# Patient Record
Sex: Female | Born: 1995 | Race: White | Hispanic: No | Marital: Single
Health system: Midwestern US, Community
[De-identification: ages and names within clinical notes are randomized; demographics above are authoritative.]

## PROBLEM LIST (undated history)

## (undated) DIAGNOSIS — F988 Other specified behavioral and emotional disorders with onset usually occurring in childhood and adolescence: Secondary | ICD-10-CM

## (undated) DIAGNOSIS — S42309A Unspecified fracture of shaft of humerus, unspecified arm, initial encounter for closed fracture: Secondary | ICD-10-CM

## (undated) DIAGNOSIS — F909 Attention-deficit hyperactivity disorder, unspecified type: Secondary | ICD-10-CM

## (undated) HISTORY — DX: Unspecified fracture of shaft of humerus, unspecified arm, initial encounter for closed fracture: S42.309A

## (undated) HISTORY — DX: Other specified behavioral and emotional disorders with onset usually occurring in childhood and adolescence: F98.8

---
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
## 2014-01-16 ENCOUNTER — Encounter: Payer: Self-pay | Admitting: Internal Medicine

## 2014-03-10 ENCOUNTER — Telehealth: Payer: Self-pay | Admitting: Internal Medicine

## 2014-03-10 NOTE — Telephone Encounter (Signed)
Pt has misplace rx methylphenidate 20 mg. Please needs another rx

## 2014-03-10 NOTE — Telephone Encounter (Signed)
Formatting of this note might be different from the original.  Document more info .  Repeat tox screen .  And refill for one month.   Electronically signed by Madelin HeadingsPanosh, Wanda K, MD at 03/10/2014  5:30 PM EDT

## 2014-03-10 NOTE — Telephone Encounter (Signed)
Formatting of this note might be different from the original.  Pt has misplace rx methylphenidate 20 mg. Please needs another rx  Electronically signed by Leafy Roobinson, Norma J at 03/10/2014 11:08 AM EDT

## 2014-03-10 NOTE — Telephone Encounter (Signed)
Document more info . Repeat tox screen .  And refill for one month.

## 2014-03-11 NOTE — Telephone Encounter (Signed)
Formatting of this note might be different from the original.  I spoke to the patient's mom.  She wanted to know if Dr. Fabian SharpPanosh would be willing to give her prescriptions of methylphenidate while she was seeing her other physician in [place].  The reason is because Dr. Fabian SharpPanosh knows her and will give her more than one prescription at a time.  Her other physician only gives one prescription.  Mom stated that it was difficult to get her prescription to her while she is staying on campus in [place] at college.  Also difficult to do this once every month.  I spoke to Dr. Fabian SharpPanosh who advised that due to the medication being controlled, she would give the pt one prescription but we would need to talk to the new provider and advise him/her what we have done.  Also, the patient's controlled substance contract states that she will get this medication from only one provider.  I spoke to the patient's mom and informed her of all.  She stated that she has talked to the new provider and they have worked out a Higher education careers adviserplan.  Dr. Rosezella FloridaPanosh's assistance is no longer required.  Instructed mom to call back if there was anything else we could do.   Electronically signed by Nils FlackAdkins, Misty T, CMA at 03/11/2014  4:38 PM EDT

## 2014-03-11 NOTE — Telephone Encounter (Signed)
Formatting of this note might be different from the original.  Mom states pt cannot find rx, thought the pharm had kept the hard script and her refills were there at pharm. Pharm states they gave back to pt her scripts, pt states she must have threw the bag away.  Pt is off at college, parents have moved out of state, and pt will not be back to see you. Now mom tells me the rx will need to be mailed to pt. Informed Mom that RX cannot be mailed, must be pu.   FYI: Pt has seen a new md in [place], who is aware pt will need new scripts for this med, but was prepared to  fill them after hers were gone.     Electronically signed by Crist InfanteHarrald, Kathy J at 03/11/2014  8:42 AM EDT

## 2014-03-11 NOTE — Telephone Encounter (Signed)
I spoke to the patient's mom.  She wanted to know if Dr. Fabian SharpPanosh would be willing to give her prescriptions of methylphenidate while she was seeing her other physician in [place].  The reason is because Dr. Fabian SharpPanosh knows her and will give her more than one prescription at a time.  Her other physician only gives one prescription.  Mom stated that it was difficult to get her prescription to her while she is staying on campus in [place] at college.  Also difficult to do this once every month.  I spoke to Dr. Fabian SharpPanosh who advised that due to the medication being controlled, she would give the pt one prescription but we would need to talk to the new provider and advise him/her what we have done.  Also, the patient's controlled substance contract states that she will get this medication from only one provider.  I spoke to the patient's mom and informed her of all.  She stated that she has talked to the new provider and they have worked out a Higher education careers adviserplan.  Dr. Rosezella FloridaPanosh's assistance is no longer required.  Instructed mom to call back if there was anything else we could do.

## 2014-03-11 NOTE — Telephone Encounter (Signed)
Mom states pt cannot find rx, thought the pharm had kept the hard script and her refills were there at pharm. Pharm states they gave back to pt her scripts, pt states she must have threw the bag away.  Pt is off at college, parents have moved out of state, and pt will not be back to see you. Now mom tells me the rx will need to be mailed to pt. Informed Mom that RX cannot be mailed, must be pu.  FYI: Pt has seen a new md in [place], who is aware pt will need new scripts for this med, but was prepared to  fill them after hers were gone.

## 2017-08-10 ENCOUNTER — Ambulatory Visit: Admit: 2017-08-10 | Payer: PRIVATE HEALTH INSURANCE | Attending: Physician Assistant | Primary: Physician Assistant

## 2017-08-10 DIAGNOSIS — Z0000 Encounter for general adult medical examination without abnormal findings: Secondary | ICD-10-CM

## 2017-08-10 MED ORDER — ADDERALL XR 30 MG CAPSULE,EXTENDED RELEASE
30 mg | ORAL_CAPSULE | ORAL | 0 refills | Status: DC
Start: 2017-08-10 — End: 2017-09-10

## 2017-08-10 NOTE — Progress Notes (Signed)
Comprehensive Visit    Kelly Lindsey is a 21 y.o. female who presents for her comprehensive visit.  Pt is here to establish care, recently moved from [facility]. Needs refill of Adderall, empty bottle brought in from Dr. Caesar Chestnut    she does exercise 3-4x/wk. No CP, SOB or dyspnea.  Denies BRBPR. No NVDC    GYN: LMP 07/30/2017, never had pap    Past Medical History:   Diagnosis Date   ??? ADD (attention deficit disorder) 2005     Past Surgical History:   Procedure Laterality Date   ??? HX WISDOM TEETH EXTRACTION       Current Outpatient Prescriptions   Medication Sig Dispense Refill   ??? ADDERALL XR 30 mg XR capsule take 1 capsule by mouth every morning 30 Cap 0     Allergies   Allergen Reactions   ??? Sulfa (Sulfonamide Antibiotics) Rash     Social History   Substance Use Topics   ??? Smoking status: Never Smoker   ??? Smokeless tobacco: Never Used   ??? Alcohol use 3.0 - 3.6 oz/week     5 - 6 Cans of beer per week      Family History   Problem Relation Age of Onset   ??? No Known Problems Mother    ??? No Known Problems Father    ??? No Known Problems Brother    ??? Stroke Maternal Grandmother        Depression screen negative.      Routine maintenance:          Health Maintenance   Topic Date Due   ??? HPV Age 42Y-26Y (1 of 3 - Female 3 Dose Series) 09/10/2006   ??? DTaP/Tdap/Td series (1 - Tdap) 09/10/2016   ??? PAP AKA CERVICAL CYTOLOGY  09/10/2016   ??? Influenza Age 42 to Adult  07/11/2017        ROS: as per HPI otherwise NEG    Objective:  Visit Vitals   ??? BP 126/82   ??? Ht 5\' 8"  (1.727 m)   ??? Wt 150 lb (68 kg)   ??? LMP 08/03/2017  Comment: IUD   ??? BMI 22.81 kg/m2       Physical Exam  Slender caucasian, female, well groomed, in NAD  HEENT -- NC/AT, PERRL, TM intact, Throat w/o erythema, tongue midline, good dentition  Neck -- Supple, non-tender, no LAD. no thyromegaly.   Heart -- RRR. No M.  Lungs -- CTA.  Abd -- Soft. NT/ND. No masses. BS present.  Extremities -- No LE edema b/l, no cyanosis or erythema, distal pulses intact.   Breasts -- No abnormal masses or ax LAD   Skin -- no rash or lesions noted, normal turgor, no diaphresis    No results found for this or any previous visit (from the past 24 hour(s)).      Assessment/Plan:      ICD-10-CM ICD-9-CM    1. Well woman exam (no gynecological exam) Z00.00 V70.0 COLLECTION VENOUS BLOOD,VENIPUNCTURE      CBC WITH AUTOMATED DIFF      LIPID PANEL      METABOLIC PANEL, BASIC      TSH 3RD GENERATION      HIV 1/2 AG/AB, 4TH GENERATION,W RFLX CONFIRM   2. Attention deficit disorder, unspecified hyperactivity presence F98.8 314.00 ADDERALL XR 30 mg XR capsule  Contract signed; F/U monthly for refills       Routine maintenance as above.  Labs sent, will contact  with abnormals  Diet & exercise regimen rev'd  Advised reg dental & eye exam  GYN/PAP in 1 mon      Author:  Jeramyah Goodpasture A Sherrier-Edwards, PA 08/10/2017 1:59 PM

## 2017-08-11 LAB — CBC WITH AUTOMATED DIFF
ABS. BASOPHILS: 0 10*3/uL (ref 0.0–0.2)
ABS. EOSINOPHILS: 0.1 10*3/uL (ref 0.0–0.4)
ABS. IMM. GRANS.: 0 10*3/uL (ref 0.0–0.1)
ABS. MONOCYTES: 1.1 10*3/uL — ABNORMAL HIGH (ref 0.1–0.9)
ABS. NEUTROPHILS: 5.3 10*3/uL (ref 1.4–7.0)
Abs Lymphocytes: 1.3 10*3/uL (ref 0.7–3.1)
BASOPHILS: 0 %
EOSINOPHILS: 1 %
HCT: 41.1 % (ref 34.0–46.6)
HGB: 13.8 g/dL (ref 11.1–15.9)
IMMATURE GRANULOCYTES: 0 %
Lymphocytes: 17 %
MCH: 32.2 pg (ref 26.6–33.0)
MCHC: 33.6 g/dL (ref 31.5–35.7)
MCV: 96 fL (ref 79–97)
MONOCYTES: 14 %
NEUTROPHILS: 68 %
PLATELET: 276 10*3/uL (ref 150–379)
RBC: 4.28 x10E6/uL (ref 3.77–5.28)
RDW: 12.2 % — ABNORMAL LOW (ref 12.3–15.4)
WBC: 7.8 10*3/uL (ref 3.4–10.8)

## 2017-08-11 LAB — LIPID PANEL
Cholesterol, total: 178 mg/dL (ref 100–199)
HDL Cholesterol: 68 mg/dL (ref >39)
LDL, calculated: 96 mg/dL (ref 0–99)
Triglyceride: 69 mg/dL (ref 0–149)
VLDL, calculated: 14 mg/dL (ref 5–40)

## 2017-08-11 LAB — METABOLIC PANEL, BASIC
BUN/Creatinine ratio: 15 (ref 9–23)
BUN: 10 mg/dL (ref 6–20)
CO2: 24 mmol/L (ref 20–29)
Calcium: 9.6 mg/dL (ref 8.7–10.2)
Chloride: 100 mmol/L (ref 96–106)
Creatinine: 0.66 mg/dL (ref 0.57–1.00)
GFR est AA: 146 mL/min/{1.73_m2} (ref >59)
GFR est non-AA: 127 mL/min/{1.73_m2} (ref >59)
Glucose: 98 mg/dL (ref 65–99)
Potassium: 4.2 mmol/L (ref 3.5–5.2)
Sodium: 140 mmol/L (ref 134–144)

## 2017-08-11 LAB — HIV 1/2 ANTIGEN/ANTIBODY, FOURTH GENERATION W/RFL: HIV Screen 4th Generation wRfx: NONREACTIVE

## 2017-08-11 LAB — TSH 3RD GENERATION: TSH: 1.1 u[IU]/mL (ref 0.450–4.500)

## 2017-08-11 LAB — HIV 1/2 AG/AB, 4TH GENERATION,W RFLX CONFIRM: HIV SCREEN 4TH GENERATION WRFX: NONREACTIVE

## 2017-08-15 NOTE — Progress Notes (Signed)
Pt made aware of normal labs

## 2017-09-07 ENCOUNTER — Encounter: Attending: Physician Assistant | Primary: Physician Assistant

## 2017-09-10 ENCOUNTER — Ambulatory Visit: Admit: 2017-09-10 | Payer: PRIVATE HEALTH INSURANCE | Attending: Physician Assistant | Primary: Physician Assistant

## 2017-09-10 DIAGNOSIS — F988 Other specified behavioral and emotional disorders with onset usually occurring in childhood and adolescence: Secondary | ICD-10-CM

## 2017-09-10 MED ORDER — ADDERALL XR 30 MG CAPSULE,EXTENDED RELEASE
30 mg | ORAL_CAPSULE | ORAL | 0 refills | Status: DC
Start: 2017-09-10 — End: 2017-10-09

## 2017-09-10 NOTE — Progress Notes (Signed)
Kelly Lindsey is a 21 y.o. female and presents for   Chief Complaint   Patient presents with   ??? Follow-up     1 month and pap     No complaints   Denies cp, sob or dyspnea. No NVDC or dysuria    Current Outpatient Prescriptions   Medication Sig Dispense Refill   ??? ADDERALL XR 30 mg XR capsule take 1 capsule by mouth every morning 30 Cap 0     Allergies   Allergen Reactions   ??? Sulfa (Sulfonamide Antibiotics) Rash     Past Medical History:   Diagnosis Date   ??? ADD (attention deficit disorder) 2005     Past Surgical History:   Procedure Laterality Date   ??? HX WISDOM TEETH EXTRACTION       Family History   Problem Relation Age of Onset   ??? No Known Problems Mother    ??? No Known Problems Father    ??? No Known Problems Brother    ??? Stroke Maternal Grandmother      Social History   Substance Use Topics   ??? Smoking status: Never Smoker   ??? Smokeless tobacco: Never Used   ??? Alcohol use 3.0 - 3.6 oz/week     5 - 6 Cans of beer per week        ROS: as per HPI otherwise NEG    Objective:  Visit Vitals   ??? BP 110/74   ??? Ht 5\' 8"  (1.727 m)   ??? Wt 150 lb (68 kg)   ??? BMI 22.81 kg/m2       In NAD. Alert.         No results found for this or any previous visit (from the past 24 hour(s)).    Assessment/Plan:    ICD-10-CM ICD-9-CM    1. Attention deficit disorder, unspecified hyperactivity presence F98.8 314.00 ADDERALL XR 30 mg XR capsule   2. Medication refill Z76.0 V68.1        RTO 1 mon GYN/PAP      Author:  Adair Lauderback A Sherrier-Edwards, PA 09/10/2017 11:41 AM

## 2017-10-09 ENCOUNTER — Ambulatory Visit: Admit: 2017-10-09 | Payer: PRIVATE HEALTH INSURANCE | Attending: Physician Assistant | Primary: Physician Assistant

## 2017-10-09 DIAGNOSIS — Z01419 Encounter for gynecological examination (general) (routine) without abnormal findings: Secondary | ICD-10-CM

## 2017-10-09 MED ORDER — ADDERALL XR 30 MG CAPSULE,EXTENDED RELEASE
30 mg | ORAL_CAPSULE | ORAL | 0 refills | Status: DC
Start: 2017-10-09 — End: 2017-11-07

## 2017-10-09 NOTE — Progress Notes (Signed)
Kelly Lindsey is a 21 y.o. female and presents with   Chief Complaint   Patient presents with   ??? Gyn Exam   ??? 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 No NVDC or urinary sx    Current Outpatient Medications   Medication Sig Dispense Refill   ??? ADDERALL XR 30 mg XR capsule take 1 capsule by mouth every morning 30 Cap 0     Allergies   Allergen Reactions   ??? Sulfa (Sulfonamide Antibiotics) Rash     Past Medical History:   Diagnosis Date   ??? ADD (attention deficit disorder) 2005     Past Surgical History:   Procedure Laterality Date   ??? HX WISDOM TEETH EXTRACTION       Family History   Problem Relation Age of Onset   ??? No Known Problems Mother    ??? No Known Problems Father    ??? No Known Problems Brother    ??? Stroke Maternal Grandmother      Social History     Tobacco Use   ??? Smoking status: Never Smoker   ??? Smokeless tobacco: Never Used   Substance Use Topics   ??? Alcohol use: Yes     Alcohol/week: 3.0 - 3.6 oz     Types: 5 - 6 Cans of beer per week        ROS: as per HPI otherwise NEG    Objective:  Visit Vitals  BP 120/70   Ht 5\' 8"  (1.727 m)   Wt 148 lb (67.1 kg)   BMI 22.50 kg/m??       In NAD. Alert.  Breast: done at last visit  Pelvic: Ext genitalia without lesion  Cx: closed, smooth, no contact bleeding, normal mucoid d/c, no CMT  Biman: uterus and ovaries non-tender, not enlarged  Rectal: without lesions         No results found for this or any previous visit (from the past 24 hour(s)).    Assessment/Plan:    ICD-10-CM ICD-9-CM    1. Well female exam with routine gynecological exam Z01.419 V72.31 PAP IG, CT-NG, RFX HPV [id number], [id number])      CANCELED: PAP IG, CT-NG, AOZ-[id number], [id number])   2. Attention deficit disorder, unspecified hyperactivity presence F98.8 314.00 ADDERALL XR 30 mg XR capsule    3. Medication refill Z76.0 V68.1        Pt counseled extensively about HSV  PAP, sent with gc/ct  Adderall refilled  F/U 1 mon         Author:  Casady Voshell A Sherrier-Edwards, PA 10/10/2017 2:53 PM

## 2017-10-09 NOTE — Progress Notes (Signed)
LM with normal results

## 2017-10-11 LAB — PAP IG, CT-NG, RFX HPV ASCU(183194, 507301)
.: 0
CHLAMYDIA, NUC. ACID AMP, 186114: NEGATIVE
Chlamydia, Nuc. Acid Amp.: NEGATIVE
GONOCOCCUS, NUC. ACID AMP, 186122: NEGATIVE
Gonococcus, Nuc. Acid Amp.: NEGATIVE
LABCORP 019018: 0

## 2017-11-07 ENCOUNTER — Ambulatory Visit: Admit: 2017-11-07 | Payer: PRIVATE HEALTH INSURANCE | Attending: Physician Assistant | Primary: Physician Assistant

## 2017-11-07 DIAGNOSIS — F988 Other specified behavioral and emotional disorders with onset usually occurring in childhood and adolescence: Secondary | ICD-10-CM

## 2017-11-07 MED ORDER — ADDERALL XR 30 MG CAPSULE,EXTENDED RELEASE
30 mg | ORAL_CAPSULE | ORAL | 0 refills | Status: DC
Start: 2017-11-07 — End: 2017-11-07

## 2017-11-07 MED ORDER — ADDERALL XR 30 MG CAPSULE,EXTENDED RELEASE
30 mg | ORAL_CAPSULE | ORAL | 0 refills | Status: DC
Start: 2017-11-07 — End: 2018-02-04

## 2017-11-07 NOTE — Progress Notes (Signed)
Kelly Lindsey is a 21 y.o. female and presents with   Chief Complaint   Patient presents with   ??? Follow-up     1 month (meds)     No complaints, will be traveling for X-mas    Denies cp, sob or dyspnea. No NVDC or Urinary sx    Current Outpatient Medications   Medication Sig Dispense Refill   ??? [START ON 01/07/2018] ADDERALL XR 30 mg XR capsule Earliest Fill Date: 01/07/18.  take 1 capsule by mouth every morning 30 Cap 0     Allergies   Allergen Reactions   ??? Sulfa (Sulfonamide Antibiotics) Rash     Past Medical History:   Diagnosis Date   ??? ADD (attention deficit disorder) 2005     Past Surgical History:   Procedure Laterality Date   ??? HX WISDOM TEETH EXTRACTION       Family History   Problem Relation Age of Onset   ??? No Known Problems Mother    ??? No Known Problems Father    ??? No Known Problems Brother    ??? Stroke Maternal Grandmother      Social History     Tobacco Use   ??? Smoking status: Never Smoker   ??? Smokeless tobacco: Never Used   Substance Use Topics   ??? Alcohol use: Yes     Alcohol/week: 3.0 - 3.6 oz     Types: 5 - 6 Cans of beer per week        ROS: as per HPI otherwise NEG    Objective:  Visit Vitals  BP 108/70   Wt 148 lb (67.1 kg)   BMI 22.50 kg/m??       In NAD. Alert.       No results found for this or any previous visit (from the past 24 hour(s)).    Assessment/Plan:    ICD-10-CM ICD-9-CM    1. Attention deficit disorder, unspecified hyperactivity presence F98.8 314.00 ADDERALL XR 30 mg XR capsule      DISCONTINUED: ADDERALL XR 30 mg XR capsule      DISCONTINUED: ADDERALL XR 30 mg XR capsule       Meds refilled   RTO 3 mon      Author:  Anela Bensman A Sherrier-Edwards, PA 11/07/2017 11:46 AM

## 2018-02-04 ENCOUNTER — Ambulatory Visit: Admit: 2018-02-04 | Attending: Physician Assistant | Primary: Physician Assistant

## 2018-02-04 DIAGNOSIS — F988 Other specified behavioral and emotional disorders with onset usually occurring in childhood and adolescence: Secondary | ICD-10-CM

## 2018-02-04 MED ORDER — ADDERALL XR 30 MG CAPSULE,EXTENDED RELEASE
30 mg | ORAL_CAPSULE | ORAL | 0 refills | Status: DC
Start: 2018-02-04 — End: 2018-02-04

## 2018-02-04 MED ORDER — ADDERALL XR 30 MG CAPSULE,EXTENDED RELEASE
30 mg | ORAL_CAPSULE | ORAL | 0 refills | Status: DC
Start: 2018-02-04 — End: 2018-05-02

## 2018-02-04 NOTE — Progress Notes (Signed)
Kelly Lindsey is a 22 y.o. female and presents with   Chief Complaint   Patient presents with   ??? Follow-up     Here for refills, no complaints    Denies cp, sob or dyspnea. No NVDC or Urinary sx    Current Outpatient Medications   Medication Sig Dispense Refill   ??? [START ON 04/04/2018] ADDERALL XR 30 mg XR capsule Earliest Fill Date: 04/04/18.  take 1 capsule by mouth every morning 30 Cap 0     Allergies   Allergen Reactions   ??? Sulfa (Sulfonamide Antibiotics) Rash     Past Medical History:   Diagnosis Date   ??? ADD (attention deficit disorder) 2005     Past Surgical History:   Procedure Laterality Date   ??? HX WISDOM TEETH EXTRACTION       Family History   Problem Relation Age of Onset   ??? No Known Problems Mother    ??? No Known Problems Father    ??? No Known Problems Brother    ??? Stroke Maternal Grandmother      Social History     Tobacco Use   ??? Smoking status: Never Smoker   ??? Smokeless tobacco: Never Used   Substance Use Topics   ??? Alcohol use: Yes     Alcohol/week: 3.0 - 3.6 oz     Types: 5 - 6 Cans of beer per week        ROS: as per HPI otherwise NEG    Objective:  Visit Vitals  BP 120/70   Ht 5\' 8"  (1.727 m)   BMI 22.50 kg/m??       In NAD. Alert.  Heart -- RRR.  Lungs -- CTA.  Abdomen -- Benign.       No results found for this or any previous visit (from the past 24 hour(s)).    Assessment/Plan:    ICD-10-CM ICD-9-CM    1. Attention deficit disorder, unspecified hyperactivity presence F98.8 314.00 ADDERALL XR 30 mg XR capsule      DISCONTINUED: ADDERALL XR 30 mg XR capsule      DISCONTINUED: ADDERALL XR 30 mg XR capsule       Refills given   F/U 3 mon/PRN       Author:  Hoby Kawai A Sherrier-Edwards, PA 02/04/2018 11:04 AM

## 2018-05-02 ENCOUNTER — Encounter

## 2018-05-02 ENCOUNTER — Telehealth

## 2018-05-02 MED ORDER — ADDERALL XR 30 MG CAPSULE,EXTENDED RELEASE
30 mg | ORAL_CAPSULE | ORAL | 0 refills | Status: DC
Start: 2018-05-02 — End: 2018-07-30

## 2018-05-02 MED ORDER — ADDERALL XR 30 MG CAPSULE,EXTENDED RELEASE
30 mg | ORAL_CAPSULE | ORAL | 0 refills | Status: DC
Start: 2018-05-02 — End: 2018-05-02

## 2018-05-02 NOTE — Telephone Encounter (Signed)
Adderall Xr 30 mg

## 2018-07-29 ENCOUNTER — Telehealth

## 2018-07-29 NOTE — Telephone Encounter (Signed)
Adderall XR 30 mg

## 2018-07-30 MED ORDER — ADDERALL XR 30 MG CAPSULE,EXTENDED RELEASE
30 mg | ORAL_CAPSULE | ORAL | 0 refills | Status: DC
Start: 2018-07-30 — End: 2018-08-28

## 2018-07-30 NOTE — Telephone Encounter (Signed)
rx printed

## 2018-08-28 ENCOUNTER — Telehealth

## 2018-08-28 MED ORDER — ADDERALL XR 30 MG CAPSULE,EXTENDED RELEASE
30 mg | ORAL_CAPSULE | ORAL | 0 refills | Status: DC
Start: 2018-08-28 — End: 2018-09-27

## 2018-08-28 NOTE — Telephone Encounter (Signed)
Pt would like a refill on her    ADDERALL XR 30 mg       She would also like to be referred to a Gynecologist please    Ph# [phone]

## 2018-09-26 ENCOUNTER — Telehealth

## 2018-09-26 NOTE — Telephone Encounter (Signed)
Pt would like a refill on her      ADDERALL XR 30 mg ( she would like a 3 month supply again please)      Ph# [phone]

## 2018-09-27 ENCOUNTER — Encounter

## 2018-09-27 MED ORDER — ADDERALL XR 30 MG CAPSULE,EXTENDED RELEASE
30 mg | ORAL_CAPSULE | ORAL | 0 refills | Status: DC
Start: 2018-09-27 — End: 2018-12-25

## 2018-09-27 MED ORDER — ADDERALL XR 30 MG CAPSULE,EXTENDED RELEASE
30 mg | ORAL_CAPSULE | ORAL | 0 refills | Status: DC
Start: 2018-09-27 — End: 2018-09-27

## 2018-12-25 ENCOUNTER — Telehealth

## 2018-12-25 MED ORDER — ADDERALL XR 30 MG CAPSULE,EXTENDED RELEASE
30 mg | ORAL_CAPSULE | ORAL | 0 refills | Status: DC
Start: 2018-12-25 — End: 2019-01-22

## 2018-12-25 NOTE — Telephone Encounter (Signed)
Pt would like a refill on her    ADDERALL XR 30 mg    Ph# [phone] Pt would like a call when it is ready to pick up

## 2019-01-22 ENCOUNTER — Telehealth

## 2019-01-22 MED ORDER — ADDERALL XR 30 MG CAPSULE,EXTENDED RELEASE
30 mg | ORAL_CAPSULE | ORAL | 0 refills | Status: DC
Start: 2019-01-22 — End: 2019-02-21

## 2019-01-22 NOTE — Telephone Encounter (Signed)
Pt would like a refill on her       ADDERALL XR 30 mg      Ph# [phone]

## 2019-02-21 ENCOUNTER — Ambulatory Visit: Admit: 2019-02-21 | Attending: Physician Assistant | Primary: Physician Assistant

## 2019-02-21 ENCOUNTER — Ambulatory Visit: Attending: Physician Assistant | Primary: Physician Assistant

## 2019-02-21 DIAGNOSIS — F988 Other specified behavioral and emotional disorders with onset usually occurring in childhood and adolescence: Secondary | ICD-10-CM

## 2019-02-21 MED ORDER — ADDERALL XR 30 MG CAPSULE,EXTENDED RELEASE
30 mg | ORAL_CAPSULE | ORAL | 0 refills | Status: DC
Start: 2019-02-21 — End: 2019-03-25

## 2019-02-21 NOTE — Progress Notes (Signed)
Kelly Lindsey is a 23 y.o. female and presents with   Chief Complaint   Patient presents with   ??? Medication Refill     Here for refills, no complaints    Denies cp, sob or dyspnea. No NVDC or Urinary sx    Current Outpatient Medications   Medication Sig Dispense Refill   ??? levonorgestreL (Skyla) 14 mcg/24 hrs (3 yrs) 13.5 mg IUD IUD Skyla 14 mcg/24 hrs (3 yrs) 13.5 mg intrauterine device   Take by intrauterine route.     ??? Adderall XR 30 mg XR capsule take 1 capsule by mouth every morning 30 Cap 0     Allergies   Allergen Reactions   ??? Sulfa (Sulfonamide Antibiotics) Rash     Past Medical History:   Diagnosis Date   ??? ADD (attention deficit disorder) 2005     Past Surgical History:   Procedure Laterality Date   ??? HX WISDOM TEETH EXTRACTION       Family History   Problem Relation Age of Onset   ??? No Known Problems Mother    ??? No Known Problems Father    ??? No Known Problems Brother    ??? Stroke Maternal Grandmother      Social History     Tobacco Use   ??? Smoking status: Never Smoker   ??? Smokeless tobacco: Never Used   Substance Use Topics   ??? Alcohol use: Yes     Alcohol/week: 5.0 - 6.0 standard drinks     Types: 5 - 6 Cans of beer per week        ROS: as per HPI otherwise NEG    Objective:  Visit Vitals  BP 115/60   Ht 5\' 8"  (1.727 m)   Wt 161 lb (73 kg)   BMI 24.48 kg/m??       In NAD. Alert.  Heart -- RRR.  Lungs -- CTA.    Extremities -- No edema       No results found for this or any previous visit (from the past 24 hour(s)).    Assessment/Plan:    ICD-10-CM ICD-9-CM    1. Attention deficit disorder, unspecified hyperactivity presence F98.8 314.00 Adderall XR 30 mg XR capsule       PMP checked  Adderall refilled  Advised AWV and labs in 1 mon        Author:  Angas Isabell A Charnel Giles-Edwards, PA 02/21/2019 3:22 PM

## 2019-02-21 NOTE — Telephone Encounter (Signed)
Pt needs appt before refill.  Spoke with patient set appt .

## 2019-02-21 NOTE — Telephone Encounter (Signed)
Pt would like a refill on her        ADDERALL XR 30 mg      Ph# [phone]

## 2019-02-21 NOTE — Progress Notes (Signed)
Kelly Lindsey is a 23 y.o. female and presents with   Chief Complaint   Patient presents with   ??? Medication Refill     Here for refills, no complaints    Denies cp, sob or dyspnea. No NVDC or Urinary sx    Current Outpatient Medications   Medication Sig Dispense Refill   ??? levonorgestreL (Skyla) 14 mcg/24 hrs (3 yrs) 13.5 mg IUD IUD Skyla 14 mcg/24 hrs (3 yrs) 13.5 mg intrauterine device   Take by intrauterine route.     ??? Adderall XR 30 mg XR capsule take 1 capsule by mouth every morning 30 Cap 0     Allergies   Allergen Reactions   ??? Sulfa (Sulfonamide Antibiotics) Rash     Past Medical History:   Diagnosis Date   ??? ADD (attention deficit disorder) 2005     Past Surgical History:   Procedure Laterality Date   ??? HX WISDOM TEETH EXTRACTION       Family History   Problem Relation Age of Onset   ??? No Known Problems Mother    ??? No Known Problems Father    ??? No Known Problems Brother    ??? Stroke Maternal Grandmother      Social History     Tobacco Use   ??? Smoking status: Never Smoker   ??? Smokeless tobacco: Never Used   Substance Use Topics   ??? Alcohol use: Yes     Alcohol/week: 5.0 - 6.0 standard drinks     Types: 5 - 6 Cans of beer per week        ROS: as per HPI otherwise NEG    Objective:  Visit Vitals  BP 115/60   Ht 5' 8" (1.727 m)   Wt 161 lb (73 kg)   BMI 24.48 kg/m??       In NAD. Alert.  Heart -- RRR.  Lungs -- CTA.    Extremities -- No edema       No results found for this or any previous visit (from the past 24 hour(s)).    Assessment/Plan:    ICD-10-CM ICD-9-CM    1. Attention deficit disorder, unspecified hyperactivity presence F98.8 314.00 Adderall XR 30 mg XR capsule       PMP checked  Adderall refilled  Advised AWV and labs in 1 mon        Author:  Connelly [place] A Sherrier-Edwards, PA 02/21/2019 3:22 PM

## 2019-03-25 ENCOUNTER — Encounter: Attending: Physician Assistant | Primary: Physician Assistant

## 2019-03-25 ENCOUNTER — Ambulatory Visit: Admit: 2019-03-25 | Attending: Physician Assistant | Primary: Physician Assistant

## 2019-03-25 ENCOUNTER — Ambulatory Visit: Attending: Physician Assistant | Primary: Physician Assistant

## 2019-03-25 DIAGNOSIS — Z0000 Encounter for general adult medical examination without abnormal findings: Secondary | ICD-10-CM

## 2019-03-25 MED ORDER — AMPHETAMINE-DEXTROAMPHETAMINE SR 30 MG 24 HR CAP
30 mg | ORAL_CAPSULE | ORAL | 0 refills | Status: DC
Start: 2019-03-25 — End: 2019-04-22

## 2019-03-25 NOTE — Progress Notes (Signed)
LM on VM, results normal

## 2019-03-25 NOTE — Progress Notes (Signed)
Comprehensive Visit    Kelly Lindsey is a 23 y.o. female who presents for her comprehensive visit.    Pt is doing well overall.   Is wearing a mask and practicing social distancing when outside, also washing hands regularly.    she does exercise 3-4x/wk: bike, yoga. No CP, SOB or dyspnea.  Denies BRBPR. No NVDC    GYN: LMP 03/21/2019; LPS 07/2017: Normal.    Eye exam: > 60yrs  Dental cleaning: >74yrs  Derm: no visit, wears sunblock    Past Medical History:   Diagnosis Date   ??? ADD (attention deficit disorder) 2005     Past Surgical History:   Procedure Laterality Date   ??? HX WISDOM TEETH EXTRACTION       Current Outpatient Medications   Medication Sig Dispense Refill   ??? amphetamine-dextroamphetamine XR (Adderall XR) 30 mg XR capsule take 1 capsule by mouth every morning 30 Cap 0   ??? levonorgestreL (Skyla) 14 mcg/24 hrs (3 yrs) 13.5 mg IUD IUD Skyla 14 mcg/24 hrs (3 yrs) 13.5 mg intrauterine device   Take by intrauterine route.       Allergies   Allergen Reactions   ??? Sulfa (Sulfonamide Antibiotics) Rash     Social History     Tobacco Use   ??? Smoking status: Never Smoker   ??? Smokeless tobacco: Never Used   Substance Use Topics   ??? Alcohol use: Yes     Alcohol/week: 5.0 - 6.0 standard drinks     Types: 5 - 6 Cans of beer per week      Family History   Problem Relation Age of Onset   ??? No Known Problems Mother    ??? No Known Problems Father    ??? No Known Problems Brother    ??? Stroke Maternal Grandmother        Depression screen negative.      Routine maintenance:          Health Maintenance   Topic Date Due   ??? HPV Age 9Y-26Y (1 - 2-dose series) 09/10/2006   ??? DTaP/Tdap/Td series (1 - Tdap) 09/10/2016   ??? Influenza Age 18 to Adult  07/12/2019   ??? PAP AKA CERVICAL CYTOLOGY  10/09/2020   ??? Pneumococcal 0-64 years  Aged Out        ROS: as per HPI otherwise NEG    Objective:  Visit Vitals  BP 110/80   Ht 5\' 8"  (1.727 m)   Wt 158 lb (71.7 kg)   BMI 24.02 kg/m??       Physical Exam  Slender, caucasian female, well groomed, in  NAD  HEENT -- NC/AT, TM intact, Throat w/o erythema, tongue midline  Neck -- Supple, non-tender, no LAD. no thyromegaly.   Heart -- RRR. No M.  Lungs -- CTA.  Abd -- Soft. NT/ND. No masses. BS present.  Extremities -- No LE edema b/l, no cyanosis or erythema, distal pulses intact.  Breasts -- No abnormal masses or ax LAD   Skin -- dry, warm, no rash or lesions noted.    No results found for this or any previous visit (from the past 24 hour(s)).      Assessment/Plan:      ICD-10-CM ICD-9-CM    1. General medical exam Z00.00 V70.9 COLLECTION VENOUS BLOOD,VENIPUNCTURE      CBC WITH AUTOMATED DIFF      METABOLIC PANEL, COMPREHENSIVE      LIPID PANEL      TETANUS, DIPHTHERIA  TOXOIDS AND ACELLULAR PERTUSSIS VACCINE (TDAP), IN INDIVIDS. >=7, IM      UA/M W/RFLX CULTURE, COMP      CHLAMYDIA/GC PCR   2. Attention deficit disorder, unspecified hyperactivity presence F98.8 314.00 amphetamine-dextroamphetamine XR (Adderall XR) 30 mg XR capsule   3. Medication refill Z76.0 V68.1    4. Need for Tdap vaccination Z23 V06.1 TETANUS, DIPHTHERIA TOXOIDS AND ACELLULAR PERTUSSIS VACCINE (TDAP), IN INDIVIDS. >=7, IM       Routine maintenance as above.  Labs sent, will contact if abnormal  Diet & exercise regimen rev'd  Advised reg dental & eye exam  Schedule of future lab studies rev'd with pt    Tdap today  Further mang't pending lab results  RTO 59yr/PRN    Adderall refilled  Can call for refill in 1 mon         Author:  Caylie Sandquist A Amarylis Rovito-Edwards, PA 03/25/2019 9:36 AM

## 2019-03-25 NOTE — Progress Notes (Signed)
Comprehensive Visit    Kelly Lindsey is a 23 y.o. female who presents for her comprehensive visit.    Pt is doing well overall.   Is wearing a mask and practicing social distancing when outside, also washing hands regularly.    she does exercise 3-4x/wk: bike, yoga. No CP, SOB or dyspnea.  Denies BRBPR. No NVDC    GYN: LMP 03/21/2019; LPS 07/2017: Normal.    Eye exam: > 4644yrs  Dental cleaning: >8944yrs  Derm: no visit, wears sunblock    Past Medical History:   Diagnosis Date   ??? ADD (attention deficit disorder) 2005     Past Surgical History:   Procedure Laterality Date   ??? HX WISDOM TEETH EXTRACTION       Current Outpatient Medications   Medication Sig Dispense Refill   ??? amphetamine-dextroamphetamine XR (Adderall XR) 30 mg XR capsule take 1 capsule by mouth every morning 30 Cap 0   ??? levonorgestreL (Skyla) 14 mcg/24 hrs (3 yrs) 13.5 mg IUD IUD Skyla 14 mcg/24 hrs (3 yrs) 13.5 mg intrauterine device   Take by intrauterine route.       Allergies   Allergen Reactions   ??? Sulfa (Sulfonamide Antibiotics) Rash     Social History     Tobacco Use   ??? Smoking status: Never Smoker   ??? Smokeless tobacco: Never Used   Substance Use Topics   ??? Alcohol use: Yes     Alcohol/week: 5.0 - 6.0 standard drinks     Types: 5 - 6 Cans of beer per week      Family History   Problem Relation Age of Onset   ??? No Known Problems Mother    ??? No Known Problems Father    ??? No Known Problems Brother    ??? Stroke Maternal Grandmother        Depression screen negative.      Routine maintenance:          Health Maintenance   Topic Date Due   ??? HPV Age 9Y-26Y (1 - 2-dose series) 09/10/2006   ??? DTaP/Tdap/Td series (1 - Tdap) 09/10/2016   ??? Influenza Age 349 to Adult  07/12/2019   ??? PAP AKA CERVICAL CYTOLOGY  10/09/2020   ??? Pneumococcal 0-64 years  Aged Out        ROS: as per HPI otherwise NEG    Objective:  Visit Vitals  BP 110/80   Ht 5\' 8"  (1.727 m)   Wt 158 lb (71.7 kg)   BMI 24.02 kg/m??       Physical Exam   Slender, caucasian female, well groomed, in NAD  HEENT -- NC/AT, TM intact, Throat w/o erythema, tongue midline  Neck -- Supple, non-tender, no LAD. no thyromegaly.   Heart -- RRR. No M.  Lungs -- CTA.  Abd -- Soft. NT/ND. No masses. BS present.  Extremities -- No LE edema b/l, no cyanosis or erythema, distal pulses intact.  Breasts -- No abnormal masses or ax LAD   Skin -- dry, warm, no rash or lesions noted.    No results found for this or any previous visit (from the past 24 hour(s)).      Assessment/Plan:      ICD-10-CM ICD-9-CM    1. General medical exam Z00.00 V70.9 COLLECTION VENOUS BLOOD,VENIPUNCTURE      CBC WITH AUTOMATED DIFF      METABOLIC PANEL, COMPREHENSIVE      LIPID PANEL      TETANUS, DIPHTHERIA  TOXOIDS AND ACELLULAR PERTUSSIS VACCINE (TDAP), IN INDIVIDS. >=7, IM      UA/M W/RFLX CULTURE, COMP      CHLAMYDIA/GC PCR   2. Attention deficit disorder, unspecified hyperactivity presence F98.8 314.00 amphetamine-dextroamphetamine XR (Adderall XR) 30 mg XR capsule   3. Medication refill Z76.0 V68.1    4. Need for Tdap vaccination Z23 V06.1 TETANUS, DIPHTHERIA TOXOIDS AND ACELLULAR PERTUSSIS VACCINE (TDAP), IN INDIVIDS. >=7, IM       Routine maintenance as above.  Labs sent, will contact if abnormal  Diet & exercise regimen rev'd  Advised reg dental & eye exam  Schedule of future lab studies rev'd with pt    Tdap today  Further mang't pending lab results  RTO 59yr/PRN    Adderall refilled  Can call for refill in 1 mon         Author:  Caylie Sandquist A Sherrier-Edwards, PA 03/25/2019 9:36 AM

## 2019-03-26 LAB — UA/M W/RFLX CULTURE, COMP
Bilirubin, Urine: NEGATIVE
Bilirubin: NEGATIVE
Blood, Urine: NEGATIVE
Blood: NEGATIVE
Glucose, UA: NEGATIVE
Glucose: NEGATIVE
Ketone: NEGATIVE
Ketones, Urine: NEGATIVE
Leukocyte Esterase, Urine: NEGATIVE
Leukocyte Esterase: NEGATIVE
Nitrite, Urine: NEGATIVE
Nitrites: NEGATIVE
Protein, UA: NEGATIVE
Protein: NEGATIVE
Specific Gravity, UA: 1.028 NA (ref 1.005–1.030)
Specific Gravity: 1.028 (ref 1.005–1.030)
Urobilinogen, Urine: 0.2 mg/dL (ref 0.2–1.0)
Urobilinogen: 0.2 mg/dL (ref 0.2–1.0)
pH (UA): 6 (ref 5.0–7.5)
pH, UA: 6 NA (ref 5.0–7.5)

## 2019-03-26 LAB — CBC WITH AUTOMATED DIFF
ABS. BASOPHILS: 0 10*3/uL (ref 0.0–0.2)
ABS. EOSINOPHILS: 0.1 10*3/uL (ref 0.0–0.4)
ABS. IMM. GRANS.: 0 10*3/uL (ref 0.0–0.1)
ABS. MONOCYTES: 0.5 10*3/uL (ref 0.1–0.9)
ABS. NEUTROPHILS: 3 10*3/uL (ref 1.4–7.0)
Abs Lymphocytes: 1.5 10*3/uL (ref 0.7–3.1)
BASOPHILS: 1 %
EOSINOPHILS: 2 %
HCT: 38 % (ref 34.0–46.6)
HGB: 13.4 g/dL (ref 11.1–15.9)
IMMATURE GRANULOCYTES: 0 %
Lymphocytes: 29 %
MCH: 32.8 pg (ref 26.6–33.0)
MCHC: 35.3 g/dL (ref 31.5–35.7)
MCV: 93 fL (ref 79–97)
MONOCYTES: 10 %
NEUTROPHILS: 58 %
PLATELET: 266 10*3/uL (ref 150–450)
RBC: 4.08 x10E6/uL (ref 3.77–5.28)
RDW: 11.3 % — ABNORMAL LOW (ref 11.7–15.4)
WBC: 5.2 10*3/uL (ref 3.4–10.8)

## 2019-03-26 LAB — SPECIMEN STATUS REPORT

## 2019-03-26 LAB — LIPID PANEL
Cholesterol, Total: 187 mg/dL (ref 100–199)
Cholesterol, total: 187 mg/dL (ref 100–199)
HDL Cholesterol: 72 mg/dL (ref >39)
HDL: 72 mg/dL (ref >39)
LDL Calculated: 95 mg/dL (ref 0–99)
LDL, calculated: 95 mg/dL (ref 0–99)
Triglyceride: 101 mg/dL (ref 0–149)
Triglycerides: 101 mg/dL (ref 0–149)
VLDL Cholesterol Calculated: 20 mg/dL (ref 5–40)
VLDL, calculated: 20 mg/dL (ref 5–40)

## 2019-03-26 LAB — METABOLIC PANEL, COMPREHENSIVE
A-G Ratio: 1.7 (ref 1.2–2.2)
ALT (SGPT): 23 IU/L (ref 0–32)
AST (SGOT): 27 IU/L (ref 0–40)
Albumin: 4.3 g/dL (ref 3.9–5.0)
Alk. phosphatase: 45 IU/L (ref 39–117)
BUN/Creatinine ratio: 12 (ref 9–23)
BUN: 7 mg/dL (ref 6–20)
Bilirubin, total: 0.5 mg/dL (ref 0.0–1.2)
CO2: 23 mmol/L (ref 20–29)
Calcium: 9.4 mg/dL (ref 8.7–10.2)
Chloride: 103 mmol/L (ref 96–106)
Creatinine: 0.59 mg/dL (ref 0.57–1.00)
GFR est AA: 149 mL/min/{1.73_m2} (ref >59)
GFR est non-AA: 130 mL/min/{1.73_m2} (ref >59)
GLOBULIN, TOTAL: 2.6 g/dL (ref 1.5–4.5)
Glucose: 79 mg/dL (ref 65–99)
Potassium: 4.2 mmol/L (ref 3.5–5.2)
Protein, total: 6.9 g/dL (ref 6.0–8.5)
Sodium: 142 mmol/L (ref 134–144)

## 2019-03-26 LAB — MICROSCOPIC EXAMINATION
Casts UA: NONE SEEN /lpf
Casts: NONE SEEN /lpf

## 2019-03-26 LAB — CBC WITH AUTO DIFFERENTIAL
Basophils %: 1 %
Basophils Absolute: 0 10*3/uL (ref 0.0–0.2)
Eosinophils %: 2 %
Eosinophils Absolute: 0.1 10*3/uL (ref 0.0–0.4)
Granulocyte Absolute Count: 0 10*3/uL (ref 0.0–0.1)
Hematocrit: 38 % (ref 34.0–46.6)
Hemoglobin: 13.4 g/dL (ref 11.1–15.9)
Immature Granulocytes: 0 %
Lymphocytes %: 29 %
Lymphocytes Absolute: 1.5 10*3/uL (ref 0.7–3.1)
MCH: 32.8 pg (ref 26.6–33.0)
MCHC: 35.3 g/dL (ref 31.5–35.7)
MCV: 93 fL (ref 79–97)
Monocytes %: 10 %
Monocytes Absolute: 0.5 10*3/uL (ref 0.1–0.9)
Neutrophils %: 58 %
Neutrophils Absolute: 3 10*3/uL (ref 1.4–7.0)
Platelets: 266 10*3/uL (ref 150–450)
RBC: 4.08 x10E6/uL (ref 3.77–5.28)
RDW: 11.3 % — ABNORMAL LOW (ref 11.7–15.4)
WBC: 5.2 10*3/uL (ref 3.4–10.8)

## 2019-03-26 LAB — COMPREHENSIVE METABOLIC PANEL
ALT: 23 IU/L (ref 0–32)
AST: 27 IU/L (ref 0–40)
Albumin/Globulin Ratio: 1.7 NA (ref 1.2–2.2)
Albumin: 4.3 g/dL (ref 3.9–5.0)
Alkaline Phosphatase: 45 IU/L (ref 39–117)
BUN: 7 mg/dL (ref 6–20)
Bun/Cre Ratio: 12 NA (ref 9–23)
CO2: 23 mmol/L (ref 20–29)
Calcium: 9.4 mg/dL (ref 8.7–10.2)
Chloride: 103 mmol/L (ref 96–106)
Creatinine: 0.59 mg/dL (ref 0.57–1.00)
EGFR IF NonAfrican American: 130 mL/min/{1.73_m2} (ref >59)
GFR African American: 149 mL/min/{1.73_m2} (ref >59)
Globulin, Total: 2.6 g/dL (ref 1.5–4.5)
Glucose: 79 mg/dL (ref 65–99)
Potassium: 4.2 mmol/L (ref 3.5–5.2)
Sodium: 142 mmol/L (ref 134–144)
Total Bilirubin: 0.5 mg/dL (ref 0.0–1.2)
Total Protein: 6.9 g/dL (ref 6.0–8.5)

## 2019-03-27 LAB — CHLAMYDIA/GC PCR
CHLAMYDIA TRACHOMATIS, NAA, 188078: NEGATIVE
Chlamydia trachomatis, NAA: NEGATIVE
NEISSERIA GONORRHOEAE, NAA, 188086: NEGATIVE
Neisseria gonorrhoeae, NAA: NEGATIVE

## 2019-04-21 NOTE — Telephone Encounter (Signed)
Refill on her    ADDERALL XR 30 mg XR cap 1 cap every morning    Pt ph# [phone]    [facility]# [phone]  [address] [place], [place]

## 2019-04-22 ENCOUNTER — Encounter

## 2019-04-22 MED ORDER — AMPHETAMINE-DEXTROAMPHETAMINE SR 30 MG 24 HR CAP
30 mg | ORAL_CAPSULE | ORAL | 0 refills | Status: DC
Start: 2019-04-22 — End: 2019-05-23

## 2019-04-22 NOTE — Telephone Encounter (Signed)
Script sent

## 2019-05-22 NOTE — Telephone Encounter (Signed)
Refill     ADDERALL XR 30 mg XR cap 1 cap every morning    Pt ph# [phone]    [facility]# [phone]

## 2019-05-23 ENCOUNTER — Encounter

## 2019-05-23 MED ORDER — AMPHETAMINE-DEXTROAMPHETAMINE SR 30 MG 24 HR CAP
30 mg | ORAL_CAPSULE | ORAL | 0 refills | Status: DC
Start: 2019-05-23 — End: 2019-06-19

## 2019-06-18 ENCOUNTER — Telehealth

## 2019-06-18 NOTE — Telephone Encounter (Signed)
Pt would like a refill on her       ADDERALL XR 30 mg      Ph# [phone]    [facility] Ph# [phone]

## 2019-06-19 MED ORDER — AMPHETAMINE-DEXTROAMPHETAMINE SR 30 MG 24 HR CAP
30 mg | ORAL_CAPSULE | ORAL | 0 refills | Status: DC
Start: 2019-06-19 — End: 2019-06-20

## 2019-06-19 NOTE — Telephone Encounter (Signed)
PMP checked, script sent

## 2019-06-20 ENCOUNTER — Telehealth

## 2019-06-20 MED ORDER — AMPHETAMINE-DEXTROAMPHETAMINE SR 30 MG 24 HR CAP
30 mg | ORAL_CAPSULE | ORAL | 0 refills | Status: DC
Start: 2019-06-20 — End: 2019-07-22

## 2019-06-20 NOTE — Telephone Encounter (Signed)
Script sent

## 2019-06-20 NOTE — Telephone Encounter (Signed)
Refill    ADDERALL XR 30 mg XR cap 1 cap every morning - needs today please    Pt ph# [phone]    [facility]# [phone]

## 2019-07-21 ENCOUNTER — Telehealth

## 2019-07-21 NOTE — Telephone Encounter (Signed)
Pt would like a refill on her      ADDERALL XR 30 mg      Ph# [phone]    [facility] Ph# [phone]

## 2019-07-22 MED ORDER — AMPHETAMINE-DEXTROAMPHETAMINE SR 30 MG 24 HR CAP
30 mg | ORAL_CAPSULE | ORAL | 0 refills | Status: DC
Start: 2019-07-22 — End: 2019-08-20

## 2019-07-22 NOTE — Telephone Encounter (Signed)
Script sent

## 2019-08-19 ENCOUNTER — Telehealth

## 2019-08-19 NOTE — Telephone Encounter (Signed)
Adderall xr 30 mg  [facility] # [phone]

## 2019-08-20 MED ORDER — AMPHETAMINE-DEXTROAMPHETAMINE SR 30 MG 24 HR CAP
30 mg | ORAL_CAPSULE | ORAL | 0 refills | Status: DC
Start: 2019-08-20 — End: 2019-09-17

## 2019-08-20 NOTE — Telephone Encounter (Signed)
Script sent

## 2019-09-16 ENCOUNTER — Telehealth

## 2019-09-16 NOTE — Telephone Encounter (Signed)
Pt would like a refill on her      ADDERALL XR 30 mg      Ph# [phone]    [facility] Ph# [phone]

## 2019-09-17 MED ORDER — AMPHETAMINE-DEXTROAMPHETAMINE SR 30 MG 24 HR CAP
30 mg | ORAL_CAPSULE | ORAL | 0 refills | Status: DC
Start: 2019-09-17 — End: 2019-10-16

## 2019-09-17 NOTE — Telephone Encounter (Signed)
PMP checked, Script sent

## 2019-10-15 ENCOUNTER — Telehealth

## 2019-10-15 NOTE — Telephone Encounter (Signed)
Refill    ADDERALL XR 30 mg XR cap 1 cap daily    Pt ph# [phone]      [facility]# [phone]

## 2019-10-16 MED ORDER — AMPHETAMINE-DEXTROAMPHETAMINE SR 30 MG 24 HR CAP
30 mg | ORAL_CAPSULE | ORAL | 0 refills | Status: DC
Start: 2019-10-16 — End: 2019-11-13

## 2019-10-16 NOTE — Telephone Encounter (Signed)
PMP checked, script sent

## 2019-11-12 ENCOUNTER — Telehealth

## 2019-11-12 NOTE — Telephone Encounter (Signed)
Refill    ADDERALL XR 30 mg cap 1 cap every moprning    Pt ph# [phone]    [facility]# [phone]

## 2019-11-13 MED ORDER — AMPHETAMINE-DEXTROAMPHETAMINE SR 30 MG 24 HR CAP
30 mg | ORAL_CAPSULE | ORAL | 0 refills | Status: DC
Start: 2019-11-13 — End: 2019-12-15

## 2019-11-13 NOTE — Telephone Encounter (Signed)
Script sent

## 2019-12-15 ENCOUNTER — Telehealth

## 2019-12-15 MED ORDER — AMPHETAMINE-DEXTROAMPHETAMINE SR 30 MG 24 HR CAP
30 mg | ORAL_CAPSULE | ORAL | 0 refills | Status: DC
Start: 2019-12-15 — End: 2020-01-14

## 2019-12-15 NOTE — Telephone Encounter (Signed)
Pt would like a refill on her    ADDERALL XR 30 mg      Ph# [phone]    [facility] Ph# [phone]

## 2019-12-15 NOTE — Telephone Encounter (Signed)
Script sent

## 2019-12-16 NOTE — Telephone Encounter (Signed)
LMOM for patient to call to schedule appt for 6 month check in order to receive future refills

## 2019-12-31 ENCOUNTER — Ambulatory Visit: Admit: 2019-12-31 | Attending: Physician Assistant | Primary: Physician Assistant

## 2019-12-31 ENCOUNTER — Ambulatory Visit: Attending: Physician Assistant | Primary: Physician Assistant

## 2019-12-31 DIAGNOSIS — R21 Rash and other nonspecific skin eruption: Secondary | ICD-10-CM

## 2019-12-31 MED ORDER — TRIAMCINOLONE ACETONIDE 0.1 % TOPICAL CREAM
0.1 % | Freq: Two times a day (BID) | CUTANEOUS | 0 refills | Status: DC
Start: 2019-12-31 — End: 2021-07-25

## 2019-12-31 NOTE — Progress Notes (Signed)
Kelly Lindsey is a 24 y.o. female and presents with   Chief Complaint   Patient presents with   ??? Follow-up     medication   ??? Rash         Here for f/u of ADHD. Currently on Adderall XR 30mg  w/o ill effects, working well.     Also c/o dry itchy rash on shins and legs x 5 mon, +flaky and red  Hx/o eczema but mother concerned about psoriasis    Denies URI sx or fever, is wearing mask and practicing social distancing when outdoors and washing hands regularly  Denies cp, sob or dyspnea. No NVDC or Urinary sx    Current Outpatient Medications   Medication Sig Dispense Refill   ??? triamcinolone acetonide (KENALOG) 0.1 % topical cream Apply  to affected area two (2) times a day. use thin layer 45 g 0   ??? amphetamine-dextroamphetamine XR (Adderall XR) 30 mg XR capsule take 1 capsule by mouth every morning 30 Cap 0   ??? levonorgestreL (Skyla) 14 mcg/24 hrs (3 yrs) 13.5 mg IUD IUD Skyla 14 mcg/24 hrs (3 yrs) 13.5 mg intrauterine device   Take by intrauterine route.       Allergies   Allergen Reactions   ??? Sulfa (Sulfonamide Antibiotics) Rash     Past Medical History:   Diagnosis Date   ??? ADD (attention deficit disorder) 2005     Past Surgical History:   Procedure Laterality Date   ??? HX WISDOM TEETH EXTRACTION       Family History   Problem Relation Age of Onset   ??? No Known Problems Mother    ??? No Known Problems Father    ??? No Known Problems Brother    ??? Stroke Maternal Grandmother      Social History     Tobacco Use   ??? Smoking status: Never Smoker   ??? Smokeless tobacco: Never Used   Substance Use Topics   ??? Alcohol use: Yes     Alcohol/week: 5.0 - 6.0 standard drinks     Types: 5 - 6 Cans of beer per week        ROS: as per HPI otherwise NEG    Objective:  Visit Vitals  BP 100/70   Temp 97.2 ??F (36.2 ??C)   Ht 5\' 8"  (1.727 m)   Wt 162 lb (73.5 kg)   BMI 24.63 kg/m??       In NAD. Alert.  Heart -- RRR.  Lungs -- CTA.    Extremities -- No edema  Skin -- dry patches, red scaly scattered on lower legs       No results found  for this or any previous visit (from the past 24 hour(s)).    Assessment/Plan:    ICD-10-CM ICD-9-CM    1. Rash and nonspecific skin eruption  R21 782.1 triamcinolone acetonide (KENALOG) 0.1 % topical cream   2. Attention deficit disorder, unspecified hyperactivity presence  F98.8 314.00          Kenalog cream   Referred to Derm    Controlled substance contract in chart  PMP checked  To call for next refill when needed            Author:  Tylyn Derwin A Johntae Broxterman-Edwards, PA 01/01/2020 3:18 PM

## 2019-12-31 NOTE — Progress Notes (Signed)
Kelly Lindsey is a 24 y.o. female and presents with   Chief Complaint   Patient presents with   ??? Follow-up     medication   ??? Rash         Here for f/u of ADHD. Currently on Adderall XR 30mg  w/o ill effects, working well.     Also c/o dry itchy rash on shins and legs x 5 mon, +flaky and red  Hx/o eczema but mother concerned about psoriasis    Denies URI sx or fever, is wearing mask and practicing social distancing when outdoors and washing hands regularly  Denies cp, sob or dyspnea. No NVDC or Urinary sx    Current Outpatient Medications   Medication Sig Dispense Refill   ??? triamcinolone acetonide (KENALOG) 0.1 % topical cream Apply  to affected area two (2) times a day. use thin layer 45 g 0   ??? amphetamine-dextroamphetamine XR (Adderall XR) 30 mg XR capsule take 1 capsule by mouth every morning 30 Cap 0   ??? levonorgestreL (Skyla) 14 mcg/24 hrs (3 yrs) 13.5 mg IUD IUD Skyla 14 mcg/24 hrs (3 yrs) 13.5 mg intrauterine device   Take by intrauterine route.       Allergies   Allergen Reactions   ??? Sulfa (Sulfonamide Antibiotics) Rash     Past Medical History:   Diagnosis Date   ??? ADD (attention deficit disorder) 2005     Past Surgical History:   Procedure Laterality Date   ??? HX WISDOM TEETH EXTRACTION       Family History   Problem Relation Age of Onset   ??? No Known Problems Mother    ??? No Known Problems Father    ??? No Known Problems Brother    ??? Stroke Maternal Grandmother      Social History     Tobacco Use   ??? Smoking status: Never Smoker   ??? Smokeless tobacco: Never Used   Substance Use Topics   ??? Alcohol use: Yes     Alcohol/week: 5.0 - 6.0 standard drinks     Types: 5 - 6 Cans of beer per week        ROS: as per HPI otherwise NEG    Objective:  Visit Vitals  BP 100/70   Temp 97.2 ??F (36.2 ??C)   Ht 5\' 8"  (1.727 m)   Wt 162 lb (73.5 kg)   BMI 24.63 kg/m??       In NAD. Alert.  Heart -- RRR.  Lungs -- CTA.    Extremities -- No edema  Skin -- dry patches, red scaly scattered on lower legs        No results found for this or any previous visit (from the past 24 hour(s)).    Assessment/Plan:    ICD-10-CM ICD-9-CM    1. Rash and nonspecific skin eruption  R21 782.1 triamcinolone acetonide (KENALOG) 0.1 % topical cream   2. Attention deficit disorder, unspecified hyperactivity presence  F98.8 314.00          Kenalog cream   Referred to Derm    Controlled substance contract in chart  PMP checked  To call for next refill when needed            Author:  Gerianne Simonet A Sherrier-Edwards, PA 01/01/2020 3:18 PM

## 2020-01-13 ENCOUNTER — Telehealth

## 2020-01-13 NOTE — Telephone Encounter (Signed)
Pt would like a refill on her      ADDERALL XR 30 mg      Ph# [phone]    [facility] Ph# [phone]

## 2020-01-14 MED ORDER — AMPHETAMINE-DEXTROAMPHETAMINE SR 30 MG 24 HR CAP
30 mg | ORAL_CAPSULE | ORAL | 0 refills | Status: DC
Start: 2020-01-14 — End: 2020-02-20

## 2020-01-14 NOTE — Telephone Encounter (Signed)
Script sent

## 2020-02-16 NOTE — Progress Notes (Signed)
Formatting of this note is different from the original.    [place] Department of Health notified of results per regulations    Confirmed name and DOB. Let patient know test was negative. Patient asymptomatic. Getting tested due to an exposure.CDC recommends quarantining for 7 days. After day 7 after receiving a negative test result (test must occur on day 5 or later) one can stop quarantining. However, it can take up to 14 days to develop symptoms so CDC recommends watching for symptoms until 14 days after exposure. Should retest if symptoms develop at anytime. Let patient know some do test positive with no symptoms so can get another test as needed. Educated to wear a mask, stay at least 6 feet from others, wash your hands, avoid crowds.  Electronically signed by Davis Gourd, NP at 02/16/2020  5:17 PM EST

## 2020-02-18 ENCOUNTER — Telehealth

## 2020-02-18 NOTE — Telephone Encounter (Signed)
Refill    ADDERALL XR 30 mg XR cap 1 cap every morning    Pt ph# [phone]    [facility]# [phone]

## 2020-02-20 MED ORDER — AMPHETAMINE-DEXTROAMPHETAMINE SR 30 MG 24 HR CAP
30 mg | ORAL_CAPSULE | ORAL | 0 refills | Status: DC
Start: 2020-02-20 — End: 2020-03-19

## 2020-02-20 NOTE — Telephone Encounter (Signed)
Sent!

## 2020-03-18 ENCOUNTER — Telehealth

## 2020-03-18 NOTE — Telephone Encounter (Signed)
Pt would like a refill on her      ADDERALL XR 30 mg    Ph# [phone]    [facility] Ph# [phone]

## 2020-03-19 MED ORDER — AMPHETAMINE-DEXTROAMPHETAMINE SR 30 MG 24 HR CAP
30 mg | ORAL_CAPSULE | ORAL | 0 refills | Status: DC
Start: 2020-03-19 — End: 2020-04-15

## 2020-03-19 NOTE — Telephone Encounter (Signed)
Sent!

## 2020-04-13 ENCOUNTER — Telehealth

## 2020-04-13 NOTE — Telephone Encounter (Signed)
Refill    ADDERALL XR 30 mg XR cap 1 cap every morning    Pt ph# [phone]    [facility]# [phone]

## 2020-04-15 MED ORDER — AMPHETAMINE-DEXTROAMPHETAMINE SR 30 MG 24 HR CAP
30 mg | ORAL_CAPSULE | ORAL | 0 refills | Status: DC
Start: 2020-04-15 — End: 2020-05-12

## 2020-04-15 NOTE — Telephone Encounter (Signed)
Sent!

## 2020-05-11 ENCOUNTER — Telehealth

## 2020-05-11 NOTE — Telephone Encounter (Signed)
Pt would like a refill on her      ADDERALL XR 30 mg    Ph# [phone]    [facility] Ph# [phone]

## 2020-05-11 NOTE — Telephone Encounter (Signed)
Pt would like a refill on her      ADDERALL XR 30 mg    Ph# [phone]    [facility] Ph# [phone]

## 2020-05-12 MED ORDER — AMPHETAMINE-DEXTROAMPHETAMINE SR 30 MG 24 HR CAP
30 mg | ORAL_CAPSULE | ORAL | 0 refills | Status: DC
Start: 2020-05-12 — End: 2020-06-10

## 2020-05-12 NOTE — Telephone Encounter (Signed)
sent 

## 2020-06-10 ENCOUNTER — Ambulatory Visit: Admit: 2020-06-10 | Attending: Physician Assistant | Primary: Physician Assistant

## 2020-06-10 ENCOUNTER — Ambulatory Visit: Attending: Physician Assistant | Primary: Physician Assistant

## 2020-06-10 DIAGNOSIS — Z0000 Encounter for general adult medical examination without abnormal findings: Secondary | ICD-10-CM

## 2020-06-10 MED ORDER — AMPHETAMINE-DEXTROAMPHETAMINE 10 MG TAB
10 mg | ORAL_TABLET | Freq: Every day | ORAL | 0 refills | Status: DC
Start: 2020-06-10 — End: 2020-07-07

## 2020-06-10 MED ORDER — AMPHETAMINE-DEXTROAMPHETAMINE SR 30 MG 24 HR CAP
30 mg | ORAL_CAPSULE | ORAL | 0 refills | Status: DC
Start: 2020-06-10 — End: 2020-07-07

## 2020-06-10 NOTE — Progress Notes (Signed)
s/w pt, results and recommendations rev'd

## 2020-06-10 NOTE — Progress Notes (Signed)
Comprehensive Visit    Kelly Lindsey is a 24 y.o. female who presents for her comprehensive visit.      Pt is doing well overall.  ADD: on Adderall XR 30mg  feels like not enough when she works 12 hour days.     she does exercise: 4x/wk. No CP, SOB or dyspnea.  Denies BRBPR. no NVDC, no urinary sx, no pain, HA, dizziness or weakness.    GYN: LMP monthly with IUD, LPS 2018   Eye exam: >3 yrs  Dental cleaning: <6 mon    Past Medical History:   Diagnosis Date   ??? ADD (attention deficit disorder) 2005     Past Surgical History:   Procedure Laterality Date   ??? HX WISDOM TEETH EXTRACTION       Current Outpatient Medications   Medication Sig Dispense Refill   ??? dextroamphetamine-amphetamine (ADDERALL) 10 mg tablet Take 1 Tablet by mouth daily. Max Daily Amount: 10 mg. 15 Tablet 0   ??? amphetamine-dextroamphetamine XR (Adderall XR) 30 mg XR capsule take 1 capsule by mouth every morning 30 Capsule 0   ??? triamcinolone acetonide (KENALOG) 0.1 % topical cream Apply  to affected area two (2) times a day. use thin layer 45 g 0   ??? levonorgestreL (Skyla) 14 mcg/24 hrs (3 yrs) 13.5 mg IUD IUD Skyla 14 mcg/24 hrs (3 yrs) 13.5 mg intrauterine device   Take by intrauterine route.       Allergies   Allergen Reactions   ??? Sulfa (Sulfonamide Antibiotics) Rash     Social History     Tobacco Use   ??? Smoking status: Never Smoker   ??? Smokeless tobacco: Never Used   Substance Use Topics   ??? Alcohol use: Yes     Alcohol/week: 8.0 standard drinks     Types: 8 Cans of beer per week      Family History   Problem Relation Age of Onset   ??? No Known Problems Mother    ??? No Known Problems Father    ??? No Known Problems Brother    ??? Stroke Maternal Grandmother          Depression screen negative.      Routine maintenance:          Health Maintenance   Topic Date Due   ??? Hepatitis C Screening  Never done   ??? HPV Age 9Y-26Y (1 - 2-dose series) Never done   ??? Flu Vaccine (1) 08/11/2020   ??? PAP AKA CERVICAL CYTOLOGY  10/09/2020   ??? DTaP/Tdap/Td series  (2 - Td or Tdap) 03/24/2029   ??? COVID-19 Vaccine  Completed   ??? Pneumococcal 0-64 years  Aged Out        ROS: as per HPI otherwise NEG    Objective:  Visit Vitals  BP 90/60   Temp 98.8 ??F (37.1 ??C)   Ht 5\' 8"  (1.727 m)   Wt 161 lb (73 kg)   BMI 24.48 kg/m??       Physical Exam  Well developed, well nourished, caucasian female, well groomed, in NAD  HEENT -- NC/AT, PERRL, TM intact, Throat w/o erythema, tongue midline  Neck -- Supple, non-tender, no LAD. no overt thyromegaly.   Heart -- RRR. No M.  Lungs -- CTA.  Abd -- Soft. NT/ND. No masses. BS present.  Extremities -- No LE edema b/l, no cyanosis or erythema, distal pulses intact.  Breasts -- No abnormal masses or ax LAD   Skin -- dry,  warm, no rash or lesions noted.  Neuro -- non-focal exam, symmetrical facial expressions, moves all extremities    No results found for this or any previous visit (from the past 24 hour(s)).      Assessment/Plan:      ICD-10-CM ICD-9-CM    1. General medical exam  Z00.00 V70.9 COLLECTION VENOUS BLOOD,VENIPUNCTURE      CBC WITH AUTOMATED DIFF      METABOLIC PANEL, COMPREHENSIVE      LIPID PANEL      HEPATITIS C AB      OPIATES CONFIRMATION, URINE   2. Attention deficit disorder, unspecified hyperactivity presence  F98.8 314.00 OPIATES CONFIRMATION, URINE      dextroamphetamine-amphetamine (ADDERALL) 10 mg tablet      amphetamine-dextroamphetamine XR (Adderall XR) 30 mg XR capsule         Routine maintenance as above.  Labs sent, will contact if abnormal  Diet & exercise regimen rev'd  Advised reg dental & eye exam  Schedule of future lab studies rev'd with pt  SBE encouraged      Adderall XR 30mg  refilled  Adderall 10mg  added to regimen #15   Further mang't pending lab results    GYN in 6 mon  Tdap next visit                Author:  Margorie Renner A Elwood Bazinet-Edwards, PA 06/10/2020 8:43 AM

## 2020-06-18 LAB — CBC WITH AUTOMATED DIFF
ABS. BASOPHILS: 0 10*3/uL (ref 0.0–0.2)
ABS. EOSINOPHILS: 0.1 10*3/uL (ref 0.0–0.4)
ABS. IMM. GRANS.: 0 10*3/uL (ref 0.0–0.1)
ABS. MONOCYTES: 0.7 10*3/uL (ref 0.1–0.9)
ABS. NEUTROPHILS: 3.4 10*3/uL (ref 1.4–7.0)
Abs Lymphocytes: 1.7 10*3/uL (ref 0.7–3.1)
BASOPHILS: 1 %
EOSINOPHILS: 2 %
HCT: 42.3 % (ref 34.0–46.6)
HGB: 15.1 g/dL (ref 11.1–15.9)
IMMATURE GRANULOCYTES: 0 %
Lymphocytes: 28 %
MCH: 33.6 pg — ABNORMAL HIGH (ref 26.6–33.0)
MCHC: 35.7 g/dL (ref 31.5–35.7)
MCV: 94 fL (ref 79–97)
MONOCYTES: 12 %
NEUTROPHILS: 57 %
PLATELET: 256 10*3/uL (ref 150–450)
RBC: 4.49 x10E6/uL (ref 3.77–5.28)
RDW: 11.8 % (ref 11.7–15.4)
WBC: 5.9 10*3/uL (ref 3.4–10.8)

## 2020-06-18 LAB — METABOLIC PANEL, COMPREHENSIVE
A-G Ratio: 2.3 — ABNORMAL HIGH (ref 1.2–2.2)
ALT (SGPT): 18 IU/L (ref 0–32)
AST (SGOT): 21 IU/L (ref 0–40)
Albumin: 4.8 g/dL (ref 3.9–5.0)
Alk. phosphatase: 59 IU/L (ref 48–121)
BUN/Creatinine ratio: 13 (ref 9–23)
BUN: 8 mg/dL (ref 6–20)
Bilirubin, total: 0.7 mg/dL (ref 0.0–1.2)
CO2: 20 mmol/L (ref 20–29)
Calcium: 9.3 mg/dL (ref 8.7–10.2)
Chloride: 107 mmol/L — ABNORMAL HIGH (ref 96–106)
Creatinine: 0.6 mg/dL (ref 0.57–1.00)
GFR est AA: 148 mL/min/{1.73_m2} (ref >59)
GFR est non-AA: 128 mL/min/{1.73_m2} (ref >59)
GLOBULIN, TOTAL: 2.1 g/dL (ref 1.5–4.5)
Glucose: 97 mg/dL (ref 65–99)
Potassium: 4.6 mmol/L (ref 3.5–5.2)
Protein, total: 6.9 g/dL (ref 6.0–8.5)
Sodium: 141 mmol/L (ref 134–144)

## 2020-06-18 LAB — LIPID PANEL
Cholesterol, Total: 196 mg/dL (ref 100–199)
Cholesterol, total: 196 mg/dL (ref 100–199)
HDL Cholesterol: 75 mg/dL (ref >39)
HDL: 75 mg/dL (ref >39)
LDL Calculated: 102 mg/dL — ABNORMAL HIGH (ref 0–99)
LDL, calculated: 102 mg/dL — ABNORMAL HIGH (ref 0–99)
Triglyceride: 110 mg/dL (ref 0–149)
Triglycerides: 110 mg/dL (ref 0–149)
VLDL, calculated: 19 mg/dL (ref 5–40)
VLDL: 19 mg/dL (ref 5–40)

## 2020-06-18 LAB — OPIATES CONFIRMATION, URINE: Opiates: NEGATIVE ng/mL

## 2020-06-18 LAB — CBC WITH AUTO DIFFERENTIAL
Basophils %: 1 %
Basophils Absolute: 0 10*3/uL (ref 0.0–0.2)
Eosinophils %: 2 %
Eosinophils Absolute: 0.1 10*3/uL (ref 0.0–0.4)
Granulocyte Absolute Count: 0 10*3/uL (ref 0.0–0.1)
Hematocrit: 42.3 % (ref 34.0–46.6)
Hemoglobin: 15.1 g/dL (ref 11.1–15.9)
Immature Granulocytes: 0 %
Lymphocytes %: 28 %
Lymphocytes Absolute: 1.7 10*3/uL (ref 0.7–3.1)
MCH: 33.6 pg — ABNORMAL HIGH (ref 26.6–33.0)
MCHC: 35.7 g/dL (ref 31.5–35.7)
MCV: 94 fL (ref 79–97)
Monocytes %: 12 %
Monocytes Absolute: 0.7 10*3/uL (ref 0.1–0.9)
Neutrophils %: 57 %
Neutrophils Absolute: 3.4 10*3/uL (ref 1.4–7.0)
Platelets: 256 10*3/uL (ref 150–450)
RBC: 4.49 x10E6/uL (ref 3.77–5.28)
RDW: 11.8 % (ref 11.7–15.4)
WBC: 5.9 10*3/uL (ref 3.4–10.8)

## 2020-06-18 LAB — COMPREHENSIVE METABOLIC PANEL
ALT: 18 IU/L (ref 0–32)
AST: 21 IU/L (ref 0–40)
Albumin/Globulin Ratio: 2.3 NA — ABNORMAL HIGH (ref 1.2–2.2)
Albumin: 4.8 g/dL (ref 3.9–5.0)
Alkaline Phosphatase: 59 IU/L (ref 48–121)
BUN: 8 mg/dL (ref 6–20)
Bun/Cre Ratio: 13 NA (ref 9–23)
CO2: 20 mmol/L (ref 20–29)
Calcium: 9.3 mg/dL (ref 8.7–10.2)
Chloride: 107 mmol/L — ABNORMAL HIGH (ref 96–106)
Creatinine: 0.6 mg/dL (ref 0.57–1.00)
EGFR IF NonAfrican American: 128 mL/min/{1.73_m2} (ref >59)
GFR African American: 148 mL/min/{1.73_m2} (ref >59)
Globulin, Total: 2.1 g/dL (ref 1.5–4.5)
Glucose: 97 mg/dL (ref 65–99)
Potassium: 4.6 mmol/L (ref 3.5–5.2)
Sodium: 141 mmol/L (ref 134–144)
Total Bilirubin: 0.7 mg/dL (ref 0.0–1.2)
Total Protein: 6.9 g/dL (ref 6.0–8.5)

## 2020-06-18 LAB — HEPATITIS C ANTIBODY: HCV Ab: <0.1 s/co ratio (ref 0.0–0.9)

## 2020-06-18 LAB — HEPATITIS C AB: Hep C Virus Ab: <0.1 s/co ratio (ref 0.0–0.9)

## 2020-07-05 ENCOUNTER — Telehealth

## 2020-07-05 NOTE — Telephone Encounter (Signed)
Pt would like a refill on her      ADDERALL XR 30 mg   ADDERALL 10 mg     Ph# [phone]    [facility] Ph# [phone]

## 2020-07-07 MED ORDER — AMPHETAMINE-DEXTROAMPHETAMINE 10 MG TAB
10 mg | ORAL_TABLET | Freq: Every day | ORAL | 0 refills | Status: DC
Start: 2020-07-07 — End: 2020-08-03

## 2020-07-07 MED ORDER — AMPHETAMINE-DEXTROAMPHETAMINE SR 30 MG 24 HR CAP
30 mg | ORAL_CAPSULE | ORAL | 0 refills | Status: DC
Start: 2020-07-07 — End: 2020-08-03

## 2020-07-07 NOTE — Telephone Encounter (Signed)
sent 

## 2020-08-02 ENCOUNTER — Telehealth

## 2020-08-02 NOTE — Telephone Encounter (Signed)
Pt would like a refill on her      ADDERALL XR 30 mg     Ph# [phone]    [facility] Ph# [phone]

## 2020-08-03 ENCOUNTER — Ambulatory Visit: Admit: 2020-08-03 | Attending: Physician Assistant | Primary: Physician Assistant

## 2020-08-03 ENCOUNTER — Ambulatory Visit: Attending: Physician Assistant | Primary: Physician Assistant

## 2020-08-03 DIAGNOSIS — F988 Other specified behavioral and emotional disorders with onset usually occurring in childhood and adolescence: Secondary | ICD-10-CM

## 2020-08-03 MED ORDER — AMPHETAMINE-DEXTROAMPHETAMINE 10 MG TAB
10 mg | ORAL_TABLET | Freq: Every day | ORAL | 0 refills | Status: AC
Start: 2020-08-03 — End: 2020-09-01

## 2020-08-03 MED ORDER — AMPHETAMINE-DEXTROAMPHETAMINE SR 30 MG 24 HR CAP
30 mg | ORAL_CAPSULE | Freq: Every day | ORAL | 0 refills | Status: DC
Start: 2020-08-03 — End: 2020-10-26

## 2020-08-03 MED ORDER — AMPHETAMINE-DEXTROAMPHETAMINE SR 30 MG 24 HR CAP
30 mg | ORAL_CAPSULE | Freq: Every day | ORAL | 0 refills | Status: AC
Start: 2020-08-03 — End: 2020-09-01

## 2020-08-03 MED ORDER — AMPHETAMINE-DEXTROAMPHETAMINE 10 MG TAB
10 mg | ORAL_TABLET | Freq: Every day | ORAL | 0 refills | Status: DC
Start: 2020-08-03 — End: 2020-10-26

## 2020-08-03 MED ORDER — AMPHETAMINE-DEXTROAMPHETAMINE SR 30 MG 24 HR CAP
30 mg | ORAL_CAPSULE | Freq: Every day | ORAL | 0 refills | Status: AC
Start: 2020-08-03 — End: 2020-10-01

## 2020-08-03 MED ORDER — AMPHETAMINE-DEXTROAMPHETAMINE 10 MG TAB
10 mg | ORAL_TABLET | Freq: Every day | ORAL | 0 refills | Status: AC
Start: 2020-08-03 — End: 2020-10-01

## 2020-08-03 NOTE — Telephone Encounter (Signed)
Script sent x3 mon

## 2020-08-03 NOTE — Progress Notes (Signed)
Kelly Lindsey is a 24 y.o. female and presents with   Chief Complaint   Patient presents with   ??? Medication Evaluation       Here for refills of Adderall  States 10mg  working well with XR 30mg  daily  No sleep or appetite disturbances        Denies cp, sob or dyspnea. No NVDC or Urinary sx    Current Outpatient Medications   Medication Sig Dispense Refill   ??? dextroamphetamine-amphetamine (ADDERALL) 10 mg tablet Take 1 Tablet by mouth daily for 29 days. Max Daily Amount: 10 mg. 30 Tablet 0   ??? [START ON 09/02/2020] dextroamphetamine-amphetamine (ADDERALL) 10 mg tablet Take 1 Tablet by mouth daily for 29 days. Max Daily Amount: 10 mg. 30 Tablet 0   ??? [START ON 10/02/2020] dextroamphetamine-amphetamine (ADDERALL) 10 mg tablet Take 1 Tablet by mouth daily for 29 days. Max Daily Amount: 10 mg. 30 Tablet 0   ??? amphetamine-dextroamphetamine XR (ADDERALL XR) 30 mg XR capsule Take 1 Capsule by mouth daily for 29 days. Max Daily Amount: 30 mg. 30 Capsule 0   ??? [START ON 09/02/2020] amphetamine-dextroamphetamine XR (ADDERALL XR) 30 mg XR capsule Take 1 Capsule by mouth daily for 29 days. Max Daily Amount: 30 mg. 30 Capsule 0   ??? [START ON 10/02/2020] amphetamine-dextroamphetamine XR (ADDERALL XR) 30 mg XR capsule Take 1 Capsule by mouth daily for 29 days. Max Daily Amount: 30 mg. 30 Capsule 0   ??? triamcinolone acetonide (KENALOG) 0.1 % topical cream Apply  to affected area two (2) times a day. use thin layer 45 g 0   ??? levonorgestreL (Skyla) 14 mcg/24 hrs (3 yrs) 13.5 mg IUD IUD Skyla 14 mcg/24 hrs (3 yrs) 13.5 mg intrauterine device   Take by intrauterine route.       Allergies   Allergen Reactions   ??? Sulfa (Sulfonamide Antibiotics) Rash     Past Medical History:   Diagnosis Date   ??? ADD (attention deficit disorder) 2005     Past Surgical History:   Procedure Laterality Date   ??? HX WISDOM TEETH EXTRACTION       Family History   Problem Relation Age of Onset   ??? No Known Problems Mother    ??? No Known Problems Father    ???  No Known Problems Brother    ??? Stroke Maternal Grandmother      Social History     Tobacco Use   ??? Smoking status: Never Smoker   ??? Smokeless tobacco: Never Used   Substance Use Topics   ??? Alcohol use: Yes     Alcohol/week: 8.0 standard drinks     Types: 8 Cans of beer per week        ROS: as per HPI otherwise NEG    Objective:  Visit Vitals  BP 120/70   Temp 97 ??F (36.1 ??C)   Ht 5\' 8"  (1.727 m)   Wt 164 lb (74.4 kg)   BMI 24.94 kg/m??       In NAD. Alert.  Heart -- RRR.  Lungs -- CTA.  Abdomen -- Benign   Extremities -- No edema        No results found for this or any previous visit (from the past 24 hour(s)).    Assessment/Plan:    ICD-10-CM ICD-9-CM    1. Attention deficit disorder, unspecified hyperactivity presence  F98.8 314.00 dextroamphetamine-amphetamine (ADDERALL) 10 mg tablet      dextroamphetamine-amphetamine (ADDERALL) 10 mg tablet  dextroamphetamine-amphetamine (ADDERALL) 10 mg tablet           Script sent  For GYN and med evaluation in 6 mon             Author:  Chaunice Obie A Luby Seamans-Edwards, PA 08/03/2020 3:13 PM

## 2020-10-26 ENCOUNTER — Telehealth

## 2020-10-26 MED ORDER — AMPHETAMINE-DEXTROAMPHETAMINE SR 30 MG 24 HR CAP
30 mg | ORAL_CAPSULE | Freq: Every day | ORAL | 0 refills | Status: AC
Start: 2020-10-26 — End: 2020-12-24

## 2020-10-26 MED ORDER — AMPHETAMINE-DEXTROAMPHETAMINE SR 30 MG 24 HR CAP
30 mg | ORAL_CAPSULE | Freq: Every day | ORAL | 0 refills | Status: AC
Start: 2020-10-26 — End: 2020-11-24

## 2020-10-26 MED ORDER — AMPHETAMINE-DEXTROAMPHETAMINE 10 MG TAB
10 mg | ORAL_TABLET | Freq: Every day | ORAL | 0 refills | Status: AC
Start: 2020-10-26 — End: 2020-12-24

## 2020-10-26 MED ORDER — AMPHETAMINE-DEXTROAMPHETAMINE 10 MG TAB
10 mg | ORAL_TABLET | Freq: Every day | ORAL | 0 refills | Status: DC
Start: 2020-10-26 — End: 2021-01-21

## 2020-10-26 MED ORDER — AMPHETAMINE-DEXTROAMPHETAMINE 10 MG TAB
10 mg | ORAL_TABLET | Freq: Every day | ORAL | 0 refills | Status: AC
Start: 2020-10-26 — End: 2020-11-24

## 2020-10-26 MED ORDER — AMPHETAMINE-DEXTROAMPHETAMINE SR 30 MG 24 HR CAP
30 mg | ORAL_CAPSULE | Freq: Every day | ORAL | 0 refills | Status: DC
Start: 2020-10-26 — End: 2021-01-21

## 2020-10-26 NOTE — Telephone Encounter (Signed)
Scripts sent x3 mon

## 2020-10-26 NOTE — Telephone Encounter (Signed)
refill    ADDERALL XR 30 mg tab     ADDERALL 10 mg tab     Pt [facility]# [phone]    [facility] [phone]

## 2021-01-21 ENCOUNTER — Telehealth

## 2021-01-21 MED ORDER — AMPHETAMINE-DEXTROAMPHETAMINE 10 MG TAB
10 mg | ORAL_TABLET | Freq: Every day | ORAL | 0 refills | Status: AC
Start: 2021-01-21 — End: 2021-02-19

## 2021-01-21 MED ORDER — AMPHETAMINE-DEXTROAMPHETAMINE SR 30 MG 24 HR CAP
30 mg | ORAL_CAPSULE | Freq: Every day | ORAL | 0 refills | Status: DC
Start: 2021-01-21 — End: 2021-01-24

## 2021-01-21 NOTE — Telephone Encounter (Signed)
Pt would like a refill on her      ADDERALL XR 30 mg   ADDERALL 10 mg       Ph# [phone]    [facility] Ph# [phone]

## 2021-01-21 NOTE — Telephone Encounter (Signed)
Refills sent

## 2021-01-24 ENCOUNTER — Telehealth

## 2021-01-24 MED ORDER — AMPHETAMINE-DEXTROAMPHETAMINE SR 30 MG 24 HR CAP
30 mg | ORAL_CAPSULE | Freq: Every day | ORAL | 0 refills | Status: DC
Start: 2021-01-24 — End: 2021-01-25

## 2021-01-24 NOTE — Telephone Encounter (Signed)
Script sent to new pharmacy

## 2021-01-25 MED ORDER — AMPHETAMINE-DEXTROAMPHETAMINE SR 30 MG 24 HR CAP
30 mg | ORAL_CAPSULE | Freq: Every day | ORAL | 0 refills | Status: AC
Start: 2021-01-25 — End: 2021-02-23

## 2021-01-25 NOTE — Telephone Encounter (Signed)
Adderall XR sent to new pharmacy

## 2021-01-25 NOTE — Telephone Encounter (Signed)
Pt needs a refill on her       ADDERALL XR 30 mg     Ph# [phone]      [facility] [phone]

## 2021-01-26 ENCOUNTER — Encounter: Attending: Physician Assistant | Primary: Physician Assistant

## 2021-01-26 ENCOUNTER — Ambulatory Visit: Admit: 2021-01-26 | Attending: Physician Assistant | Primary: Physician Assistant

## 2021-01-26 ENCOUNTER — Ambulatory Visit: Attending: Physician Assistant | Primary: Physician Assistant

## 2021-01-26 DIAGNOSIS — F988 Other specified behavioral and emotional disorders with onset usually occurring in childhood and adolescence: Secondary | ICD-10-CM

## 2021-01-26 NOTE — Progress Notes (Signed)
Called patient

## 2021-01-26 NOTE — Progress Notes (Signed)
Kelly Lindsey is a 25 y.o. female and presents with   Chief Complaint   Patient presents with   ??? Medication Refill       Here for 6 mon med evaluation  ADHD -- on Adderall as dir, no ill affects, not affecting sleep.  Has been watching diet and losing weight    Request STD check, new partner, no sx     Denies cp, sob or dyspnea. No NVDC or Urinary sx    Current Outpatient Medications   Medication Sig Dispense Refill   ??? amphetamine-dextroamphetamine XR (ADDERALL XR) 30 mg XR capsule Take 1 Capsule by mouth daily for 29 days. Max Daily Amount: 30 mg. 30 Capsule 0   ??? dextroamphetamine-amphetamine (ADDERALL) 10 mg tablet Take 1 Tablet by mouth daily for 29 days. Max Daily Amount: 10 mg. 30 Tablet 0   ??? triamcinolone acetonide (KENALOG) 0.1 % topical cream Apply  to affected area two (2) times a day. use thin layer 45 g 0   ??? levonorgestreL (Skyla) 14 mcg/24 hrs (3 yrs) 13.5 mg IUD IUD Skyla 14 mcg/24 hrs (3 yrs) 13.5 mg intrauterine device   Take by intrauterine route.       Allergies   Allergen Reactions   ??? Sulfa (Sulfonamide Antibiotics) Rash     Past Medical History:   Diagnosis Date   ??? ADD (attention deficit disorder) 2005     Past Surgical History:   Procedure Laterality Date   ??? HX WISDOM TEETH EXTRACTION       Family History   Problem Relation Age of Onset   ??? No Known Problems Mother    ??? No Known Problems Father    ??? No Known Problems Brother    ??? Stroke Maternal Grandmother      Social History     Tobacco Use   ??? Smoking status: Never Smoker   ??? Smokeless tobacco: Never Used   Substance Use Topics   ??? Alcohol use: Yes     Alcohol/week: 8.0 standard drinks     Types: 8 Cans of beer per week        ROS: as per HPI otherwise NEG    Objective:  Visit Vitals  BP 100/60   Temp 98 ??F (36.7 ??C)   Ht 5\' 8"  (1.727 m)   Wt 152 lb (68.9 kg)   BMI 23.11 kg/m??       In NAD. Alert.  Heart -- RRR.  Lungs -- CTA.  Abdomen -- Benign   Extremities -- No edema        No results found for this or any previous visit (from  the past 24 hour(s)).    Assessment/Plan:    ICD-10-CM ICD-9-CM    1. Attention deficit disorder, unspecified hyperactivity presence  F98.8 314.00    2. Possible exposure to STD  Z20.2 V01.6 CT/NG/T.VAGINALIS AMPLIFICATION      HIV 1/2 AG/AB, 4TH GENERATION,W RFLX CONFIRM      RPR       PMP checked   Labs sent  cpe 6 mon             Author:  Andray Assefa A Ruben Mahler-Edwards, PA 01/26/2021 3:25 PM

## 2021-01-30 LAB — HIV 1/2 ANTIGEN/ANTIBODY, FOURTH GENERATION W/RFL: HIV Screen 4th Generation wRfx: NONREACTIVE

## 2021-01-30 LAB — CT/NG/T.VAGINALIS AMPLIFICATION
C. trachomatis by NAA: NEGATIVE
CHLAMYDIA BY NAA, 183161: NEGATIVE
GONOCOCCUS BY NAA, 183162: NEGATIVE
N. gonorrhoeae by NAA: NEGATIVE
T. vaginalis by NAA: NEGATIVE
TRICH VAG BY NAA: NEGATIVE

## 2021-01-30 LAB — RPR
RPR: NONREACTIVE
RPR: NONREACTIVE

## 2021-01-30 LAB — HIV 1/2 AG/AB, 4TH GENERATION,W RFLX CONFIRM: HIV SCREEN 4TH GENERATION WRFX: NONREACTIVE

## 2021-02-16 ENCOUNTER — Telehealth

## 2021-02-17 MED ORDER — AMPHETAMINE-DEXTROAMPHETAMINE 10 MG TAB
10 mg | ORAL_TABLET | Freq: Every day | ORAL | 0 refills | Status: AC
Start: 2021-02-17 — End: 2021-04-17

## 2021-02-17 MED ORDER — AMPHETAMINE-DEXTROAMPHETAMINE 10 MG TAB
10 mg | ORAL_TABLET | Freq: Every day | ORAL | 0 refills | Status: DC
Start: 2021-02-17 — End: 2021-05-16

## 2021-02-17 MED ORDER — AMPHETAMINE-DEXTROAMPHETAMINE 10 MG TAB
10 mg | ORAL_TABLET | Freq: Every day | ORAL | 0 refills | Status: AC
Start: 2021-02-17 — End: 2021-03-18

## 2021-02-17 MED ORDER — AMPHETAMINE-DEXTROAMPHETAMINE SR 30 MG 24 HR CAP
30 mg | ORAL_CAPSULE | Freq: Every day | ORAL | 0 refills | Status: AC
Start: 2021-02-17 — End: 2021-04-17

## 2021-02-17 MED ORDER — AMPHETAMINE-DEXTROAMPHETAMINE SR 30 MG 24 HR CAP
30 mg | ORAL_CAPSULE | Freq: Every day | ORAL | 0 refills | Status: AC
Start: 2021-02-17 — End: 2021-03-18

## 2021-02-17 MED ORDER — AMPHETAMINE-DEXTROAMPHETAMINE SR 30 MG 24 HR CAP
30 mg | ORAL_CAPSULE | Freq: Every day | ORAL | 0 refills | Status: DC
Start: 2021-02-17 — End: 2021-05-16

## 2021-02-17 NOTE — Telephone Encounter (Signed)
adderall sent x3 mon

## 2021-05-16 ENCOUNTER — Telehealth

## 2021-05-16 MED ORDER — AMPHETAMINE-DEXTROAMPHETAMINE SR 30 MG 24 HR CAP
30 mg | ORAL_CAPSULE | Freq: Every day | ORAL | 0 refills | Status: DC
Start: 2021-05-16 — End: 2021-06-14

## 2021-05-16 MED ORDER — AMPHETAMINE-DEXTROAMPHETAMINE 10 MG TAB
10 mg | ORAL_TABLET | Freq: Every day | ORAL | 0 refills | Status: DC
Start: 2021-05-16 — End: 2021-06-14

## 2021-05-16 NOTE — Telephone Encounter (Signed)
Adderall refilled

## 2021-06-14 ENCOUNTER — Telehealth

## 2021-06-14 MED ORDER — AMPHETAMINE-DEXTROAMPHETAMINE 10 MG TAB
10 mg | ORAL_TABLET | Freq: Every day | ORAL | 0 refills | Status: DC
Start: 2021-06-14 — End: 2021-07-12

## 2021-06-14 MED ORDER — AMPHETAMINE-DEXTROAMPHETAMINE SR 30 MG 24 HR CAP
30 mg | ORAL_CAPSULE | Freq: Every day | ORAL | 0 refills | Status: DC
Start: 2021-06-14 — End: 2021-07-12

## 2021-06-14 NOTE — Telephone Encounter (Signed)
Pt needs a refill on her      ADDERALL XR 30 mg   ADDERALL 10 mg       Ph# [phone]    [facility] Ph# [phone]

## 2021-06-14 NOTE — Telephone Encounter (Signed)
Refill sent

## 2021-07-12 ENCOUNTER — Telehealth

## 2021-07-12 MED ORDER — AMPHETAMINE-DEXTROAMPHETAMINE SR 30 MG 24 HR CAP
30 mg | ORAL_CAPSULE | Freq: Every day | ORAL | 0 refills | Status: DC
Start: 2021-07-12 — End: 2021-08-08

## 2021-07-12 MED ORDER — AMPHETAMINE-DEXTROAMPHETAMINE 10 MG TAB
10 mg | ORAL_TABLET | Freq: Every day | ORAL | 0 refills | Status: DC
Start: 2021-07-12 — End: 2021-08-08

## 2021-07-12 NOTE — Telephone Encounter (Signed)
Pt needs a refill on her      ADDERALL XR 30 mg   ADDERALL 10 mg       Ph# [phone]    [facility]# [phone]

## 2021-07-12 NOTE — Telephone Encounter (Signed)
Error

## 2021-07-12 NOTE — Telephone Encounter (Signed)
Refill sent

## 2021-07-25 ENCOUNTER — Ambulatory Visit: Admit: 2021-07-25 | Attending: Physician Assistant | Primary: Physician Assistant

## 2021-07-25 ENCOUNTER — Ambulatory Visit: Attending: Physician Assistant | Primary: Physician Assistant

## 2021-07-25 DIAGNOSIS — Z0000 Encounter for general adult medical examination without abnormal findings: Secondary | ICD-10-CM

## 2021-07-25 MED ORDER — LEVONORGESTREL-ETHINYL ESTRADIOL 0.1 MG-20 MCG TAB
Freq: Every day | ORAL | 4 refills | Status: AC
Start: 2021-07-25 — End: ?

## 2021-07-25 NOTE — Progress Notes (Signed)
Comprehensive Visit    Kelly Lindsey is a 25 y.o. female who presents for her comprehensive visit.      Pt is doing well overall.  ADHD -- interested in changing regimen due to tolerance. Finds that she is taking the IR Adderall much earlier than before.   ER Adderall not lasting as long.    Reports enlarged L tonsils x3 days then sx resolved on its own. No pain     C/o L breast lump x6 mon, no change in size, painful at times.     she does reg exercise. No CP, SOB or dyspnea.  Denies BRBPR. no NVDC, no urinary sx, no pain, HA, dizziness or weakness.  No fever, chills, night sweats or cough.     F/b GYN: LMP monthly; LPS 2018  Eye exam: >5 yrs   Dental cleaning: >1 yr    Past Medical History:   Diagnosis Date    ADD (attention deficit disorder) 2005     Past Surgical History:   Procedure Laterality Date    HX WISDOM TEETH EXTRACTION       Current Outpatient Medications   Medication Sig Dispense Refill    levonorgestrel-ethinyl estradiol (Sronyx) 0.1-20 mg-mcg tab Take 1 Tablet by mouth in the morning. 3 Dose Pack 4    amphetamine-dextroamphetamine XR (ADDERALL XR) 30 mg XR capsule Take 1 Capsule by mouth in the morning. Max Daily Amount: 30 mg. 30 Capsule 0    dextroamphetamine-amphetamine (ADDERALL) 10 mg tablet Take 1 Tablet by mouth in the morning. Max Daily Amount: 10 mg. 30 Tablet 0     Allergies   Allergen Reactions    Sulfa (Sulfonamide Antibiotics) Rash     Social History     Tobacco Use    Smoking status: Never    Smokeless tobacco: Never   Substance Use Topics    Alcohol use: Yes     Alcohol/week: 8.0 standard drinks     Types: 8 Cans of beer per week      Family History   Problem Relation Age of Onset    No Known Problems Mother     No Known Problems Father     No Known Problems Brother     Stroke Maternal Grandmother          Depression screen negative.      Routine maintenance:          Health Maintenance   Topic Date Due    COVID-19 Vaccine (3 - Booster for Pfizer series) 08/10/2020    Flu Vaccine  (1) 08/11/2021    Depression Screen  07/25/2022    Cervical cancer screen  09/10/2025    DTaP/Tdap/Td series (2 - Td or Tdap) 03/24/2029    Hepatitis C Screening  Completed    HPV Age 9Y-26Y  Completed    Pneumococcal 0-64 years  Aged Out        ROS: as per HPI otherwise NEG    Objective:  Visit Vitals  BP 110/70   Temp 98.2 ??F (36.8 ??C)   Ht 5\' 8"  (1.727 m)   Wt 152 lb (68.9 kg)   BMI 23.11 kg/m??       Physical Exam  Well developed, well nourished, caucasian female, well groomed, in NAD  HEENT -- NC/AT, PERRL, TM intact, Throat w/o erythema, tongue midline  Neck -- Supple, non-tender, no LAD. no overt thyromegaly.   Heart -- RRR.  Lungs -- CTA.  Abd -- Soft. NT/ND. No masses. BS  present.  Extremities -- No LE edema b/l, no cyanosis or erythema, distal pulses intact.  Breasts -- L breast mass at 5 o'clock approx 1-2 cm in diameter, no nipple d/c, no ax LAD, R unremarkable   Skin -- dry, warm, no rash or lesions noted.  Neuro -- non-focal exam, symmetrical facial expressions, moves all extremities  Pelvic -- Ext genitalia without lesion  Cx -- closed, smooth, normal mucoid d/c noted, no CMT  Biman -- uterus and ovaries non-tender, not enlarged  Rectal -- without lesions    No results found for this or any previous visit (from the past 24 hour(s)).      Assessment/Plan:      ICD-10-CM ICD-9-CM    1. General medical exam  Z00.00 V70.9 COLLECTION VENOUS BLOOD,VENIPUNCTURE      CBC WITH AUTOMATED DIFF      METABOLIC PANEL, COMPREHENSIVE      LIPID PANEL      PAP IG, CT-NG-TV, NAA W/RFLX TO HPV HIGH RISK DNA      UA/M W/RFLX CULTURE, COMP      OPIATES CONFIRMATION, URINE      2. Normal gynecologic examination  Z01.419 V72.31 PAP IG, CT-NG-TV, NAA W/RFLX TO HPV HIGH RISK DNA      3. Attention deficit disorder, unspecified hyperactivity presence  F98.8 314.00 OPIATES CONFIRMATION, URINE      4. Breast lump on left side at 5 o'clock position  N63.23 611.72 MAM MAMMO BI DX INCL CAD              Routine maintenance as  above.  Labs sent, will contact if abnormal  Diet & exercise regimen rev'd  Advised reg dental & eye exam  Pt counseled, discussed fibrocystic breast, Dx Mammo ordered for confirmation   PMP checked  Options discussed, pt request Adderall 10mg  BID with next refill  RTO 66yr/PRN                    Author:  Miasha Emmons A Priyanka Causey-Edwards, PA 07/26/2021 2:21 PM

## 2021-07-27 LAB — PAP IG, CT-NG TV RFX APTIMA HPV ASCUS, ASC-H, LSIL, HSIL, AGUS
.: 0
CHLAMYDIA, NUC. ACID AMP, 186134: NEGATIVE
Chlamydia, Nuc. Acid Amp: NEGATIVE
GONOCOCCUS, NUC. ACID AMP, 186135: NEGATIVE
Gonococcus, Nuc. Acid Amp: NEGATIVE
LABCORP 019018: 0
TRICH VAG BY NAA: NEGATIVE
Trich vag by NAA: NEGATIVE

## 2021-07-28 LAB — METABOLIC PANEL, COMPREHENSIVE
A-G Ratio: 2.1 (ref 1.2–2.2)
ALT (SGPT): 12 IU/L (ref 0–32)
AST (SGOT): 20 IU/L (ref 0–40)
Albumin: 4.6 g/dL (ref 3.9–5.0)
Alk. phosphatase: 53 IU/L (ref 44–121)
BUN/Creatinine ratio: 17 (ref 9–23)
BUN: 11 mg/dL (ref 6–20)
Bilirubin, total: 0.4 mg/dL (ref 0.0–1.2)
CO2: 16 mmol/L — ABNORMAL LOW (ref 20–29)
Calcium: 9.7 mg/dL (ref 8.7–10.2)
Chloride: 101 mmol/L (ref 96–106)
Creatinine: 0.66 mg/dL (ref 0.57–1.00)
GLOBULIN, TOTAL: 2.2 g/dL (ref 1.5–4.5)
Glucose: 94 mg/dL (ref 65–99)
Potassium: 4.4 mmol/L (ref 3.5–5.2)
Protein, total: 6.8 g/dL (ref 6.0–8.5)
Sodium: 136 mmol/L (ref 134–144)
eGFR: 125 mL/min/{1.73_m2} (ref >59)

## 2021-07-28 LAB — CBC WITH AUTOMATED DIFF
ABS. BASOPHILS: 0 10*3/uL (ref 0.0–0.2)
ABS. EOSINOPHILS: 0.1 10*3/uL (ref 0.0–0.4)
ABS. IMM. GRANS.: 0 10*3/uL (ref 0.0–0.1)
ABS. MONOCYTES: 0.6 10*3/uL (ref 0.1–0.9)
ABS. NEUTROPHILS: 5.2 10*3/uL (ref 1.4–7.0)
Abs Lymphocytes: 1.9 10*3/uL (ref 0.7–3.1)
BASOPHILS: 1 %
EOSINOPHILS: 2 %
HCT: 42.2 % (ref 34.0–46.6)
HGB: 14.4 g/dL (ref 11.1–15.9)
IMMATURE GRANULOCYTES: 0 %
Lymphocytes: 24 %
MCH: 33 pg (ref 26.6–33.0)
MCHC: 34.1 g/dL (ref 31.5–35.7)
MCV: 97 fL (ref 79–97)
MONOCYTES: 8 %
NEUTROPHILS: 65 %
PLATELET: 282 10*3/uL (ref 150–450)
RBC: 4.37 x10E6/uL (ref 3.77–5.28)
RDW: 11.6 % — ABNORMAL LOW (ref 11.7–15.4)
WBC: 7.9 10*3/uL (ref 3.4–10.8)

## 2021-07-28 LAB — UA/M W/RFLX CULTURE, COMP
Bilirubin, Urine: NEGATIVE
Bilirubin: NEGATIVE
Blood, Urine: NEGATIVE
Blood: NEGATIVE
Glucose, UA: NEGATIVE
Glucose: NEGATIVE
Leukocyte Esterase, Urine: NEGATIVE
Leukocyte Esterase: NEGATIVE
Nitrite, Urine: NEGATIVE
Nitrites: NEGATIVE
Protein, UA: NEGATIVE
Protein: NEGATIVE
Specific Gravity, UA: 1.023 NA (ref 1.005–1.030)
Specific Gravity: 1.023 (ref 1.005–1.030)
Urobilinogen, Urine: 0.2 mg/dL (ref 0.2–1.0)
Urobilinogen: 0.2 mg/dL (ref 0.2–1.0)
pH (UA): 6.5 (ref 5.0–7.5)
pH, UA: 6.5 NA (ref 5.0–7.5)

## 2021-07-28 LAB — LIPID PANEL
Cholesterol, Total: 190 mg/dL (ref 100–199)
Cholesterol, total: 190 mg/dL (ref 100–199)
HDL Cholesterol: 80 mg/dL (ref >39)
HDL: 80 mg/dL (ref >39)
LDL Calculated: 82 mg/dL (ref 0–99)
LDL, calculated: 82 mg/dL (ref 0–99)
Triglyceride: 168 mg/dL — ABNORMAL HIGH (ref 0–149)
Triglycerides: 168 mg/dL — ABNORMAL HIGH (ref 0–149)
VLDL, calculated: 28 mg/dL (ref 5–40)
VLDL: 28 mg/dL (ref 5–40)

## 2021-07-28 LAB — OPIATES CONFIRMATION, URINE: Opiates: NEGATIVE ng/mL

## 2021-07-28 LAB — CBC WITH AUTO DIFFERENTIAL
Basophils %: 1 %
Basophils Absolute: 0 10*3/uL (ref 0.0–0.2)
Eosinophils %: 2 %
Eosinophils Absolute: 0.1 10*3/uL (ref 0.0–0.4)
Granulocyte Absolute Count: 0 10*3/uL (ref 0.0–0.1)
Hematocrit: 42.2 % (ref 34.0–46.6)
Hemoglobin: 14.4 g/dL (ref 11.1–15.9)
Immature Granulocytes: 0 %
Lymphocytes %: 24 %
Lymphocytes Absolute: 1.9 10*3/uL (ref 0.7–3.1)
MCH: 33 pg (ref 26.6–33.0)
MCHC: 34.1 g/dL (ref 31.5–35.7)
MCV: 97 fL (ref 79–97)
Monocytes %: 8 %
Monocytes Absolute: 0.6 10*3/uL (ref 0.1–0.9)
Neutrophils %: 65 %
Neutrophils Absolute: 5.2 10*3/uL (ref 1.4–7.0)
Platelets: 282 10*3/uL (ref 150–450)
RBC: 4.37 x10E6/uL (ref 3.77–5.28)
RDW: 11.6 % — ABNORMAL LOW (ref 11.7–15.4)
WBC: 7.9 10*3/uL (ref 3.4–10.8)

## 2021-07-28 LAB — COMPREHENSIVE METABOLIC PANEL
ALT: 12 IU/L (ref 0–32)
AST: 20 IU/L (ref 0–40)
Albumin/Globulin Ratio: 2.1 NA (ref 1.2–2.2)
Albumin: 4.6 g/dL (ref 3.9–5.0)
Alkaline Phosphatase: 53 IU/L (ref 44–121)
BUN: 11 mg/dL (ref 6–20)
Bun/Cre Ratio: 17 NA (ref 9–23)
CO2: 16 mmol/L — ABNORMAL LOW (ref 20–29)
Calcium: 9.7 mg/dL (ref 8.7–10.2)
Chloride: 101 mmol/L (ref 96–106)
Creatinine: 0.66 mg/dL (ref 0.57–1.00)
Est, Glomerular Filtration Rate: 125 mL/min/{1.73_m2} (ref >59)
Globulin, Total: 2.2 g/dL (ref 1.5–4.5)
Glucose: 94 mg/dL (ref 65–99)
Potassium: 4.4 mmol/L (ref 3.5–5.2)
Sodium: 136 mmol/L (ref 134–144)
Total Bilirubin: 0.4 mg/dL (ref 0.0–1.2)
Total Protein: 6.8 g/dL (ref 6.0–8.5)

## 2021-07-28 LAB — MICROSCOPIC EXAMINATION
BACTERIA, URINE: NONE SEEN
Bacteria: NONE SEEN
Casts UA: NONE SEEN /lpf
Casts: NONE SEEN /lpf
RBC, UA: NONE SEEN /hpf (ref 0–2)
RBC: NONE SEEN /hpf (ref 0–2)

## 2021-08-08 ENCOUNTER — Telehealth

## 2021-08-08 MED ORDER — AMPHETAMINE-DEXTROAMPHETAMINE 20 MG TAB
20 mg | ORAL_TABLET | Freq: Every day | ORAL | 0 refills | Status: AC
Start: 2021-08-08 — End: 2021-08-31

## 2021-08-08 MED ORDER — AMPHETAMINE-DEXTROAMPHETAMINE 10 MG TAB
10 mg | ORAL_TABLET | Freq: Every day | ORAL | 0 refills | Status: AC
Start: 2021-08-08 — End: 2021-08-31

## 2021-08-08 NOTE — Telephone Encounter (Signed)
S/w pt, concerned about decrease in dose, request Adderall 20mg  in am and 10mg  in pm. Script sent to new pharmacy

## 2021-08-08 NOTE — Telephone Encounter (Signed)
Pt wanted to provide the nurse with the new pharmacy information for medication.    [facility]: [address] [place], [place]. [address] Ph# [phone]    Ph# [phone]

## 2021-08-29 ENCOUNTER — Telehealth

## 2021-08-31 MED ORDER — AMPHETAMINE-DEXTROAMPHETAMINE 20 MG TAB
20 mg | ORAL_TABLET | Freq: Every day | ORAL | 0 refills | Status: DC
Start: 2021-08-31 — End: 2021-09-12

## 2021-08-31 MED ORDER — AMPHETAMINE-DEXTROAMPHETAMINE 10 MG TAB
10 mg | ORAL_TABLET | Freq: Every day | ORAL | 0 refills | Status: DC
Start: 2021-08-31 — End: 2021-09-30

## 2021-08-31 NOTE — Telephone Encounter (Signed)
Script sent

## 2021-09-12 ENCOUNTER — Telehealth

## 2021-09-12 MED ORDER — AMPHETAMINE-DEXTROAMPHETAMINE 20 MG TAB
20 mg | ORAL_TABLET | Freq: Every day | ORAL | 0 refills | Status: DC
Start: 2021-09-12 — End: 2021-10-13

## 2021-09-12 NOTE — Telephone Encounter (Signed)
Script sent to new pharmacy

## 2021-09-21 NOTE — Telephone Encounter (Signed)
Patient wants a referral for a Mammogram

## 2021-09-21 NOTE — Telephone Encounter (Signed)
Pt called to follow-up with the nurse regarding her mammogram. Pt has not heard from anyone appt please call     Ph# [phone]

## 2021-09-30 ENCOUNTER — Telehealth

## 2021-09-30 MED ORDER — AMPHETAMINE-DEXTROAMPHETAMINE 10 MG TAB
10 mg | ORAL_TABLET | Freq: Every day | ORAL | 0 refills | Status: DC
Start: 2021-09-30 — End: 2021-10-31

## 2021-09-30 NOTE — Telephone Encounter (Signed)
Script sent

## 2021-09-30 NOTE — Telephone Encounter (Signed)
Pt needs a refill on her     Adderall 10 mg     Ph# [phone]    [facility] [phone]

## 2021-10-10 NOTE — Telephone Encounter (Signed)
Pt needs a refill on her     Adderall 20 mg     Ph# [phone]    [facility] [phone]

## 2021-10-12 NOTE — Telephone Encounter (Signed)
Pt is returning call please call     Ph# [phone]

## 2021-10-12 NOTE — Telephone Encounter (Signed)
LM returning pt's call

## 2021-10-13 ENCOUNTER — Telehealth

## 2021-10-13 MED ORDER — AMPHETAMINE-DEXTROAMPHETAMINE 20 MG TAB
20 mg | ORAL_TABLET | Freq: Every day | ORAL | 0 refills | Status: DC
Start: 2021-10-13 — End: 2021-10-24

## 2021-10-13 NOTE — Telephone Encounter (Signed)
Script sent for Adderall 20mg  #14. Advised to fill both script at the same time to avoid confusion

## 2021-10-13 NOTE — Telephone Encounter (Signed)
Pt would like to talk with the nurse about her ADDERALL 20 mg       Ph# [phone]

## 2021-10-24 ENCOUNTER — Telehealth

## 2021-10-24 MED ORDER — AMPHETAMINE-DEXTROAMPHETAMINE 20 MG TAB
20 mg | ORAL_TABLET | Freq: Every day | ORAL | 0 refills | Status: DC
Start: 2021-10-24 — End: 2021-10-31

## 2021-10-24 NOTE — Telephone Encounter (Signed)
S/w pt, script sent

## 2021-10-31 ENCOUNTER — Telehealth

## 2021-10-31 MED ORDER — AMPHETAMINE-DEXTROAMPHETAMINE 20 MG TAB
20 mg | ORAL_TABLET | Freq: Every day | ORAL | 0 refills | Status: AC
Start: 2021-10-31 — End: 2021-11-29

## 2021-10-31 MED ORDER — AMPHETAMINE-DEXTROAMPHETAMINE 10 MG TAB
10 mg | ORAL_TABLET | Freq: Every day | ORAL | 0 refills | Status: AC
Start: 2021-10-31 — End: 2021-11-29

## 2021-10-31 NOTE — Telephone Encounter (Signed)
Refill sent

## 2021-10-31 NOTE — Telephone Encounter (Signed)
Pt needs a refill on her      ADDERALL 10 mg   ADDERALL 20 mg       Ph# [phone]    [facility] Ph# [phone]

## 2021-11-28 ENCOUNTER — Telehealth

## 2021-11-28 NOTE — Telephone Encounter (Signed)
Pt needs a refill     Adderall 10 mg     Adderall 20 mg     Ph# [phone]    [facility]# [phone]

## 2021-11-29 MED ORDER — AMPHETAMINE-DEXTROAMPHETAMINE 10 MG TAB
10 mg | ORAL_TABLET | Freq: Every day | ORAL | 0 refills | Status: DC
Start: 2021-11-29 — End: 2021-11-30

## 2021-11-29 MED ORDER — AMPHETAMINE-DEXTROAMPHETAMINE 20 MG TAB
20 mg | ORAL_TABLET | Freq: Every day | ORAL | 0 refills | Status: DC
Start: 2021-11-29 — End: 2022-02-22

## 2021-11-29 MED ORDER — AMPHETAMINE-DEXTROAMPHETAMINE 10 MG TAB
10 mg | ORAL_TABLET | Freq: Every day | ORAL | 0 refills | Status: AC
Start: 2021-11-29 — End: 2022-01-02

## 2021-11-29 MED ORDER — AMPHETAMINE-DEXTROAMPHETAMINE 20 MG TAB
20 mg | ORAL_TABLET | Freq: Every day | ORAL | 0 refills | Status: AC
Start: 2021-11-29 — End: 2021-11-30

## 2021-11-29 MED ORDER — AMPHETAMINE-DEXTROAMPHETAMINE 10 MG TAB
10 mg | ORAL_TABLET | Freq: Every day | ORAL | 0 refills | Status: AC
Start: 2021-11-29 — End: 2022-02-22

## 2021-11-29 MED ORDER — AMPHETAMINE-DEXTROAMPHETAMINE 20 MG TAB
20 mg | ORAL_TABLET | Freq: Every day | ORAL | 0 refills | Status: AC
Start: 2021-11-29 — End: 2022-01-02

## 2021-11-29 NOTE — Telephone Encounter (Signed)
Pt said that her prescriptions for ADDERALL were sent to the [facility] [address] but they are out and she needs them sent to another pharmacy please    Pt ph# [phone]    [facility] Ph# [phone]    [facility] [place] - [address]

## 2021-11-29 NOTE — Telephone Encounter (Signed)
Script sent x3 mon

## 2021-11-30 ENCOUNTER — Telehealth

## 2021-11-30 MED ORDER — AMPHETAMINE-DEXTROAMPHETAMINE 10 MG TAB
10 mg | ORAL_TABLET | Freq: Every day | ORAL | 0 refills | Status: AC
Start: 2021-11-30 — End: 2022-02-22

## 2021-11-30 MED ORDER — AMPHETAMINE-DEXTROAMPHETAMINE 20 MG TAB
20 mg | ORAL_TABLET | Freq: Every day | ORAL | 0 refills | Status: AC
Start: 2021-11-30 — End: 2022-02-22

## 2021-11-30 NOTE — Telephone Encounter (Signed)
Pt called to update pharmacy information due to the pharmacy out of stock for refill     Adderall 10 mg     Adderall 20 mg     Ph# [phone]    [facility]# [phone]

## 2021-11-30 NOTE — Telephone Encounter (Signed)
Script sent

## 2021-12-28 NOTE — Telephone Encounter (Signed)
Pt needs a refill      Adderall 10 mg      Adderall 20 mg      Ph# [phone]     [facility] Ph# [phone]    [id number] - [place], [place] - [address]

## 2021-12-28 NOTE — Telephone Encounter (Signed)
Pt called back to speak with the nurse regarding the refill request that was sent earlier please call     Ph# [phone]

## 2021-12-29 NOTE — Telephone Encounter (Signed)
Sent prior

## 2021-12-29 NOTE — Telephone Encounter (Signed)
Called patient

## 2021-12-30 NOTE — Telephone Encounter (Signed)
Pt would like a refill on     Adderall 10 mg     Adderall 20 mg     Ph# [phone]    [facility]# [phone]

## 2022-01-02 ENCOUNTER — Telehealth

## 2022-01-02 MED ORDER — AMPHETAMINE-DEXTROAMPHETAMINE 20 MG TAB
20 mg | ORAL_TABLET | Freq: Every day | ORAL | 0 refills | Status: DC
Start: 2022-01-02 — End: 2022-01-30

## 2022-01-02 MED ORDER — AMPHETAMINE-DEXTROAMPHETAMINE 10 MG TAB
10 mg | ORAL_TABLET | Freq: Every day | ORAL | 0 refills | Status: DC
Start: 2022-01-02 — End: 2022-01-30

## 2022-01-02 NOTE — Telephone Encounter (Signed)
Script sent

## 2022-01-02 NOTE — Telephone Encounter (Signed)
Patient called, the medication for adderall both strengths need to be sent to     [facility]   [address]

## 2022-01-30 ENCOUNTER — Ambulatory Visit: Admit: 2022-01-30 | Attending: Internal Medicine | Primary: Physician Assistant

## 2022-01-30 ENCOUNTER — Ambulatory Visit: Attending: Internal Medicine | Primary: Physician Assistant

## 2022-01-30 DIAGNOSIS — F322 Major depressive disorder, single episode, severe without psychotic features: Secondary | ICD-10-CM

## 2022-01-30 MED ORDER — AMPHETAMINE-DEXTROAMPHETAMINE 10 MG TAB
10 mg | ORAL_TABLET | Freq: Every day | ORAL | 0 refills | Status: DC
Start: 2022-01-30 — End: 2022-01-30

## 2022-01-30 MED ORDER — AMPHETAMINE-DEXTROAMPHETAMINE 20 MG TAB
20 mg | ORAL_TABLET | Freq: Every day | ORAL | 0 refills | Status: AC
Start: 2022-01-30 — End: 2022-02-22

## 2022-01-30 MED ORDER — AMPHETAMINE-DEXTROAMPHETAMINE 20 MG TAB
20 mg | ORAL_TABLET | Freq: Every day | ORAL | 0 refills | Status: DC
Start: 2022-01-30 — End: 2022-01-30

## 2022-01-30 MED ORDER — AMPHETAMINE-DEXTROAMPHETAMINE 10 MG TAB
10 mg | ORAL_TABLET | Freq: Every day | ORAL | 0 refills | Status: DC
Start: 2022-01-30 — End: 2022-02-22

## 2022-01-30 MED ORDER — ESCITALOPRAM 10 MG TAB
10 mg | ORAL_TABLET | Freq: Every day | ORAL | 0 refills | Status: DC
Start: 2022-01-30 — End: 2022-02-27

## 2022-01-30 NOTE — Progress Notes (Signed)
Kelly Lindsey is a 26 y.o. wf f/b Ms Sherrier CC: depression     SUBJECTIVE:    2 m Mod depression sx ;   PHQ2 =4  ; PHQ9=17 ; denies SI /HI or psychosis. Drinks 1-2 drinks per day.   Used Strattera in college     ADD - focus helped By Adderall 30 mg daily   Past Medical History:   Diagnosis Date    ADD (attention deficit disorder) 2005     Current Outpatient Medications   Medication Sig Dispense Refill    escitalopram oxalate (LEXAPRO) 10 mg tablet Take 1 Tablet by mouth daily. 30 Tablet 0    dextroamphetamine-amphetamine (ADDERALL) 10 mg tablet Take 1 Tablet by mouth daily. Max Daily Amount: 10 mg. 15 Tablet 0    dextroamphetamine-amphetamine (ADDERALL) 20 mg tablet Take 1 Tablet by mouth daily. Max Daily Amount: 20 mg. 15 Tablet 0    dextroamphetamine-amphetamine (ADDERALL) 10 mg tablet Take 1 Tablet by mouth daily. Max Daily Amount: 10 mg. 30 Tablet 0    dextroamphetamine-amphetamine (ADDERALL) 20 mg tablet Take 1 Tablet by mouth daily. Max Daily Amount: 20 mg. 30 Tablet 0    dextroamphetamine-amphetamine (ADDERALL) 20 mg tablet Take 1 Tablet by mouth daily. Max Daily Amount: 20 mg. 30 Tablet 0    dextroamphetamine-amphetamine (ADDERALL) 10 mg tablet Take 1 Tablet by mouth daily. Max Daily Amount: 10 mg. 30 Tablet 0    levonorgestrel-ethinyl estradiol (Sronyx) 0.1-20 mg-mcg tab Take 1 Tablet by mouth in the morning. 3 Dose Pack 4     Allergies   Allergen Reactions    Sulfa (Sulfonamide Antibiotics) Rash     .  Social History     Tobacco Use   Smoking Status Never   Smokeless Tobacco Never         Review of Systems  Review of Systems - negative except as per HPI      Physical Examination:  Visit Vitals  BP 100/62   Temp 98.1 ??F (36.7 ??C)   Ht 5' 8" (1.727 m)   Wt 160 lb (72.6 kg)   BMI 24.33 kg/m??     Physical Exam    General: Appears in no acute distress  Lung-  Heart - reg  Abdomen -bs+ soft, nt   Extremities -     Labs: No results found for this or any previous visit (from the past 8 hour(s)).    Marland Kitchen    Results for orders placed or performed in visit on 07/25/21   CBC WITH AUTOMATED DIFF   Result Value Ref Range    WBC 7.9 3.4 - 10.8 x10E3/uL    RBC 4.37 3.77 - 5.28 x10E6/uL    HGB 14.4 11.1 - 15.9 g/dL    HCT 42.2 34.0 - 46.6 %    MCV 97 79 - 97 fL    MCH 33.0 26.6 - 33.0 pg    MCHC 34.1 31.5 - 35.7 g/dL    RDW 11.6 (L) 11.7 - 15.4 %    PLATELET 282 150 - 450 x10E3/uL    NEUTROPHILS 65 Not Estab. %    Lymphocytes 24 Not Estab. %    MONOCYTES 8 Not Estab. %    EOSINOPHILS 2 Not Estab. %    BASOPHILS 1 Not Estab. %    ABS. NEUTROPHILS 5.2 1.4 - 7.0 x10E3/uL    Abs Lymphocytes 1.9 0.7 - 3.1 x10E3/uL    ABS. MONOCYTES 0.6 0.1 - 0.9 x10E3/uL    ABS. EOSINOPHILS  0.1 0.0 - 0.4 x10E3/uL    ABS. BASOPHILS 0.0 0.0 - 0.2 x10E3/uL    IMMATURE GRANULOCYTES 0 Not Estab. %    ABS. IMM. GRANS. 0.0 0.0 - 0.1 Y10F7/PZ   METABOLIC PANEL, COMPREHENSIVE   Result Value Ref Range    Glucose 94 65 - 99 mg/dL    BUN 11 6 - 20 mg/dL    Creatinine 0.66 0.57 - 1.00 mg/dL    eGFR 125 >59 mL/min/1.73    BUN/Creatinine ratio 17 9 - 23    Sodium 136 134 - 144 mmol/L    Potassium 4.4 3.5 - 5.2 mmol/L    Chloride 101 96 - 106 mmol/L    CO2 16 (L) 20 - 29 mmol/L    Calcium 9.7 8.7 - 10.2 mg/dL    Protein, total 6.8 6.0 - 8.5 g/dL    Albumin 4.6 3.9 - 5.0 g/dL    GLOBULIN, TOTAL 2.2 1.5 - 4.5 g/dL    A-G Ratio 2.1 1.2 - 2.2    Bilirubin, total 0.4 0.0 - 1.2 mg/dL    Alk. phosphatase 53 44 - 121 IU/L    AST (SGOT) 20 0 - 40 IU/L    ALT (SGPT) 12 0 - 32 IU/L   LIPID PANEL   Result Value Ref Range    Cholesterol, total 190 100 - 199 mg/dL    Triglyceride 168 (H) 0 - 149 mg/dL    HDL Cholesterol 80 >39 mg/dL    VLDL, calculated 28 5 - 40 mg/dL    LDL, calculated 82 0 - 99 mg/dL   UA/M Hulda Reddix/RFLX CULTURE, COMP    Specimen: Urine   Result Value Ref Range    Specific Gravity 1.023 1.005 - 1.030    pH (UA) 6.5 5.0 - 7.5    Color Yellow Yellow    Appearance Cloudy (A) Clear    Leukocyte Esterase Negative Negative    Protein Negative Negative/Trace    Glucose  Negative Negative    Ketone Trace (A) Negative    Blood Negative Negative    Bilirubin Negative Negative    Urobilinogen 0.2 0.2 - 1.0 mg/dL    Nitrites Negative Negative    Microscopic Examination Comment     Microscopic exam See additional order     URINALYSIS REFLEX Comment    OPIATES CONFIRMATION, URINE   Result Value Ref Range    Opiates Negative Cutoff=100 ng/mL    Please Note Comment    MICROSCOPIC EXAMINATION   Result Value Ref Range    WBC 0-5 0 - 5 /hpf    RBC None seen 0 - 2 /hpf    Epithelial cells 0-10 0 - 10 /hpf    Casts None seen None seen /lpf    Bacteria None seen None seen/Few   PAP IG, CT-NG TV RFX APTIMA HPV ASCUS, ASC-H, LSIL, HSIL, AGUS   Result Value Ref Range    Diagnosis Comment     Specimen adequacy Comment     Clinician provided ICD10 Comment     Performed by: Comment     . Marland Kitchen     Note: Comment     . Comment     Chlamydia, Nuc. Acid Amp Negative Negative    Gonococcus, Nuc. Acid Amp Negative Negative    Trich vag by NAA Negative Negative        Assessment/Plan    ICD-10-CM ICD-9-CM    1. 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 Attention deficit disorder, unspecified hyperactivity presence  F98.8 314.00 dextroamphetamine-amphetamine (ADDERALL) 10 mg tablet      dextroamphetamine-amphetamine (ADDERALL) 20 mg tablet      DISCONTINUED: dextroamphetamine-amphetamine (ADDERALL) 20 mg tablet      DISCONTINUED: dextroamphetamine-amphetamine (ADDERALL) 10 mg tablet        Advised more exercise, less ETOH  and canabis; ERX Lexapro discussed and sent   . ADD . PMP appears in order. Adderall renewed. Follow up with UDS and OV Ms Serrier in 1 m or prn sooner       Author:  Cashlynn Yearwood. Anibal Henderson, MD 2:38 PM2/20/2023

## 2022-02-22 ENCOUNTER — Telehealth

## 2022-02-22 MED ORDER — AMPHETAMINE-DEXTROAMPHETAMINE 20 MG TAB
20 mg | ORAL_TABLET | ORAL | 0 refills | Status: AC
Start: 2022-02-22 — End: ?

## 2022-02-22 NOTE — Telephone Encounter (Signed)
 Pt needs a refill for    Adderall 20 mg (Pt would like a quantity of 45 due to the pharmacy being out of stock of the 10 mg)    Pt Ph# [phone]    [facility] Pharm# [id number] [place], [place]     [address]     Ph# [phone]

## 2022-02-27 MED ORDER — ESCITALOPRAM 10 MG TAB
10 mg | ORAL_TABLET | Freq: Every day | ORAL | 0 refills | Status: AC
Start: 2022-02-27 — End: ?

## 2022-02-28 ENCOUNTER — Encounter: Attending: Physician Assistant | Primary: Physician Assistant

## 2022-03-03 ENCOUNTER — Ambulatory Visit: Admit: 2022-03-03 | Attending: Physician Assistant | Primary: Physician Assistant

## 2022-03-03 DIAGNOSIS — F322 Major depressive disorder, single episode, severe without psychotic features: Secondary | ICD-10-CM

## 2022-03-03 MED ORDER — AMPHETAMINE-DEXTROAMPHETAMINE 20 MG TAB
20 mg | ORAL_TABLET | ORAL | 0 refills | Status: AC
Start: 2022-03-03 — End: ?

## 2022-04-25 ENCOUNTER — Telehealth

## 2022-04-25 NOTE — Telephone Encounter (Signed)
Pt needs a refill on her      ADDERALL 20 mg (take 1-2 tabs a day)    Pt ph# [phone]    Adonis Huguenin Ph# [phone]      [facility] [place], [place]

## 2022-04-26 MED ORDER — AMPHETAMINE-DEXTROAMPHETAMINE 20 MG PO TABS
20 MG | ORAL_TABLET | ORAL | 0 refills | Status: DC
Start: 2022-04-26 — End: 2022-06-19

## 2022-04-26 NOTE — Telephone Encounter (Signed)
Script sent

## 2022-06-19 ENCOUNTER — Telehealth

## 2022-06-19 MED ORDER — AMPHETAMINE-DEXTROAMPHETAMINE 20 MG PO TABS
20 MG | ORAL_TABLET | ORAL | 0 refills | Status: AC
Start: 2022-06-19 — End: 2022-07-20

## 2022-06-19 NOTE — Telephone Encounter (Signed)
Refill sent

## 2022-06-19 NOTE — Telephone Encounter (Signed)
Pt needs a refill on her      ADDERALL 20 mg     Pt ph# [phone]    Almy[facility]ace Ph# [phone]    [facility] [id number] - [place], [place] - [address]

## 2022-06-21 NOTE — Telephone Encounter (Signed)
Error

## 2022-07-19 ENCOUNTER — Telehealth

## 2022-07-19 MED ORDER — AMPHETAMINE-DEXTROAMPHETAMINE 20 MG PO TABS
20 MG | ORAL_TABLET | ORAL | 0 refills | Status: AC
Start: 2022-07-19 — End: 2022-08-18

## 2022-07-19 NOTE — Telephone Encounter (Signed)
Pt needs a refill     Adderall 20 mg     Pt ph# [phone]    [facility] Pharm# [id number] [place], [place]     [address]     Ph# [phone]

## 2022-08-15 ENCOUNTER — Telehealth

## 2022-08-15 NOTE — Telephone Encounter (Signed)
Pt needs a refill     Adderall 20 mg ( 45 count )     Pt ph# [phone]    [facility] Pharm#  [id number] [place], [place]     [address]     Ph# [phone]    Pt would like the meds to be filled on 9/7

## 2022-08-16 MED ORDER — AMPHETAMINE-DEXTROAMPHETAMINE 20 MG PO TABS
20 MG | ORAL_TABLET | ORAL | 0 refills | Status: AC
Start: 2022-08-16 — End: 2022-09-16

## 2022-09-12 ENCOUNTER — Telehealth

## 2022-09-12 NOTE — Telephone Encounter (Signed)
Pt needs a refill     Adderall 20 mg ( 45 count )     Pt ph# [phone]    [facility] Pharm# [id number] [place], [place] [address]     Ph# [phone]

## 2022-09-13 MED ORDER — AMPHETAMINE-DEXTROAMPHETAMINE 20 MG PO TABS
20 MG | ORAL_TABLET | ORAL | 0 refills | Status: DC
Start: 2022-09-13 — End: 2022-10-08

## 2022-09-13 NOTE — Telephone Encounter (Signed)
Script sent

## 2022-09-27 ENCOUNTER — Encounter: Attending: Physician Assistant | Primary: Physician Assistant

## 2022-10-06 ENCOUNTER — Ambulatory Visit: Admit: 2022-10-06 | Payer: PRIVATE HEALTH INSURANCE | Attending: Physician Assistant | Primary: Physician Assistant

## 2022-10-06 DIAGNOSIS — Z0000 Encounter for general adult medical examination without abnormal findings: Secondary | ICD-10-CM

## 2022-10-06 NOTE — Progress Notes (Signed)
Comprehensive Visit    Kelly Lindsey is a 26 y.o. female who presents for her comprehensive visit.      Pt is doing well, no complaints  ADHD -- Adderall working well, no ill effects    she does minimal exercise. No CP, SOB or dyspnea.  Denies BRBPR. no NVDC, no urinary sx, no pain, HA, dizziness or weakness today.  No fever, chills, night sweats or cough.     GYN: LMP monthly; LPS 2022  Eye exam: >3 yr   Dental cleaning: >2 yrs     Past Medical History:   Diagnosis Date    ADD (attention deficit disorder) 2005    Depression     RESOLVED     Past Surgical History:   Procedure Laterality Date    WISDOM TOOTH EXTRACTION       Current Outpatient Medications   Medication Sig Dispense Refill    [START ON 11/15/2022] amphetamine-dextroamphetamine (ADDERALL) 20 MG tablet Take 1-2 tabs daily 45 tablet 0    [START ON 10/16/2022] amphetamine-dextroamphetamine (ADDERALL, 20MG ,) 20 MG tablet 1-2 tablets daily 45 tablet 0    ondansetron (ZOFRAN-ODT) 8 MG TBDP disintegrating tablet PLEASE SEE ATTACHED FOR DETAILED DIRECTIONS       No current facility-administered medications for this visit.     Allergies   Allergen Reactions    Sulfa Antibiotics Rash     Social History     Tobacco Use    Smoking status: Never    Smokeless tobacco: Never   Substance Use Topics    Alcohol use: Yes     Alcohol/week: 8.0 standard drinks of alcohol      Family History   Problem Relation Age of Onset    Stroke Maternal Grandmother     No Known Problems Brother     No Known Problems Mother     No Known Problems Father        Depression screen negative.      Routine maintenance:          Health Maintenance   Topic Date Due    Hepatitis B vaccine (1 of 3 - 3-dose series) Never done    Varicella vaccine (1 of 2 - 2-dose childhood series) Never done    Hepatitis A vaccine (2 of 2 - 2-dose series) 02/29/2012    COVID-19 Vaccine (3 - Pfizer series) 05/05/2020    Depression Monitoring  10/07/2023    Pap smear  07/25/2024    DTaP/Tdap/Td vaccine (2 - Td or  Tdap) 03/24/2029    HPV vaccine  Completed    Meningococcal (ACWY) vaccine  Completed    Flu vaccine  Completed    Hepatitis C screen  Completed    HIV screen  Completed    Hib vaccine  Aged Out    Pneumococcal 0-64 years Vaccine  Aged Out    Depression Screen  Discontinued    Chlamydia/GC screen  Discontinued        ROS: as per HPI otherwise NEG    Objective:  BP 110/70   Ht 1.727 m (5\' 8" )   Wt 74.8 kg (165 lb)   BMI 25.09 kg/m     Physical Exam  Well developed, well nourished, caucasian female, well groomed, in NAD  HEENT -- NC/AT, PERRL, TM intact, Throat w/o erythema, tongue midline  Neck -- Supple, non-tender, no LAD. no overt thyromegaly.   Heart -- RRR.  Lungs -- CTA.  Abd -- Soft. NT/ND. No masses. BS present.  Extremities -- No LE edema b/l, no cyanosis or erythema, distal pulses intact.  Breasts -- No abnormal masses or ax LAD.  Skin -- dry, warm, no rash or lesions noted.  Neuro -- non-focal exam, symmetrical facial expressions, moves all extremities    No results found for this or any previous visit (from the past 24 hour(s)).      Assessment/Plan:     Diagnosis Orders   1. General medical exam  Amphetamine, Urine, Confirmation, Quant    UA with Culture Reflex    Hemoglobin A1C    Lipid Panel    Comprehensive Metabolic Panel    CBC      2. Attention deficit hyperactivity disorder (ADHD), unspecified ADHD type  amphetamine-dextroamphetamine (ADDERALL) 20 MG tablet    amphetamine-dextroamphetamine (ADDERALL, 20MG ,) 20 MG tablet      3. Need for influenza vaccination  Influenza, AFLURIA, (age 55 y+), IM, MDV, 0.5 mL              PMP checked, last filled 09/15/22  Adderall refill x2 mon   Routine maintenance as above.  Labs sent, will contact if abnormal  Flu vaccine today   Further mang't pending lab results  RTO 65yr/PRN                Author:  3yr Brighid Koch, PA 10/08/2022 9:08 PM

## 2022-10-07 LAB — LIPID PANEL
Cholesterol: 196 mg/dL (ref 100–199)
HDL: 78 mg/dL (ref >39)
LDL Calculated: 99 mg/dL (ref 0–99)
Triglycerides: 112 mg/dL (ref 0–149)
VLDL Cholesterol Calculated: 19 mg/dL (ref 5–40)

## 2022-10-07 LAB — COMPREHENSIVE METABOLIC PANEL
ALT: 31 IU/L (ref 0–32)
AST: 25 IU/L (ref 0–40)
Albumin/Globulin Ratio: 2 (ref 1.2–2.2)
Albumin: 4.5 g/dL (ref 4.0–5.0)
Alkaline Phosphatase: 78 IU/L (ref 44–121)
BUN/Creatinine Ratio: 24 — ABNORMAL HIGH (ref 9–23)
BUN: 15 mg/dL (ref 6–20)
CO2: 24 mmol/L (ref 20–29)
Calcium: 9.7 mg/dL (ref 8.7–10.2)
Chloride: 102 mmol/L (ref 96–106)
Creatinine: 0.62 mg/dL (ref 0.57–1.00)
Est, Glomerular Filtration Rate: 125 mL/min/{1.73_m2} (ref >59)
Globulin, Total: 2.2 g/dL (ref 1.5–4.5)
Glucose: 96 mg/dL (ref 70–99)
Potassium: 4.6 mmol/L (ref 3.5–5.2)
Sodium: 142 mmol/L (ref 134–144)
Total Bilirubin: 0.5 mg/dL (ref 0.0–1.2)
Total Protein: 6.7 g/dL (ref 6.0–8.5)

## 2022-10-07 LAB — CBC
Hematocrit: 42.5 % (ref 34.0–46.6)
Hemoglobin: 14.2 g/dL (ref 11.1–15.9)
MCH: 32.3 pg (ref 26.6–33.0)
MCHC: 33.4 g/dL (ref 31.5–35.7)
MCV: 97 fL (ref 79–97)
Platelets: 307 10*3/uL (ref 150–450)
RBC: 4.4 x10E6/uL (ref 3.77–5.28)
RDW: 11.4 % — ABNORMAL LOW (ref 11.7–15.4)
WBC: 8.9 10*3/uL (ref 3.4–10.8)

## 2022-10-07 LAB — HEMOGLOBIN A1C: Hemoglobin A1C: 5.4 % (ref 4.8–5.6)

## 2022-10-09 LAB — UA WITH CULTURE REFLEX
Bilirubin Urine: NEGATIVE
Blood, Urine: NEGATIVE
Glucose, Ur: NEGATIVE
Ketones, Urine: NEGATIVE
Nitrite, Urine: NEGATIVE
Protein, UA: NEGATIVE
Specific Gravity, UA: 1.021 (ref 1.005–1.030)
Urobilinogen, Urine: 0.2 mg/dL (ref 0.2–1.0)
pH, UA: 5.5 (ref 5.0–7.5)

## 2022-10-09 LAB — AMPHETAMINE, URINE, CONFIRMATION, QUANTITATIVE: Amphetamine+Methamphetamine Urine Screen: POSITIVE — AB

## 2022-10-09 LAB — MICROSCOPIC EXAMINATION
Casts UA: NONE SEEN /lpf
RBC, UA: NONE SEEN /hpf (ref 0–2)

## 2022-10-09 LAB — URINE CULTURE, ROUTINE

## 2022-10-09 MED ORDER — AMPHETAMINE-DEXTROAMPHETAMINE 20 MG PO TABS
20 MG | ORAL_TABLET | ORAL | 0 refills | Status: AC
Start: 2022-10-09 — End: 2022-11-13

## 2022-10-09 MED ORDER — AMPHETAMINE-DEXTROAMPHETAMINE 20 MG PO TABS
20 MG | ORAL_TABLET | ORAL | 0 refills | Status: AC
Start: 2022-10-09 — End: 2022-12-13

## 2022-10-26 ENCOUNTER — Ambulatory Visit: Admit: 2022-10-26 | Payer: PRIVATE HEALTH INSURANCE | Attending: Physician Assistant | Primary: Physician Assistant

## 2022-10-26 DIAGNOSIS — F909 Attention-deficit hyperactivity disorder, unspecified type: Secondary | ICD-10-CM

## 2022-10-26 MED ORDER — AMPHETAMINE-DEXTROAMPHETAMINE 20 MG PO TABS
20 MG | ORAL_TABLET | Freq: Two times a day (BID) | ORAL | 0 refills | Status: DC
Start: 2022-10-26 — End: 2023-02-07

## 2022-10-26 NOTE — Progress Notes (Signed)
Kelly Lindsey is a 26 y.o. female and presents with   Chief Complaint   Patient presents with    Medication Refill         Here for med check  Reports having a new position on her job which is more demanding and has longer hours   Request Adderall BID   Denies interference with sleep, no appetite changes or mood swings       Denies cp, sob or dyspnea. No NVDC, urinary sx, weakness, dizziness or HA today    Current Outpatient Medications   Medication Sig Dispense Refill    amphetamine-dextroamphetamine (ADDERALL) 20 MG tablet Take 1 tablet by mouth 2 times daily for 30 days. Take 1-2 tabs daily Max Daily Amount: 40 mg 60 tablet 0    [START ON 11/25/2022] amphetamine-dextroamphetamine (ADDERALL, 20MG ,) 20 MG tablet Take 1 tablet by mouth 2 times daily for 30 days. Max Daily Amount: 40 mg 60 tablet 0    [START ON 12/26/2022] amphetamine-dextroamphetamine (ADDERALL, 20MG ,) 20 MG tablet Take 1 tablet by mouth 2 times daily for 30 days. Max Daily Amount: 40 mg 60 tablet 0    ondansetron (ZOFRAN-ODT) 8 MG TBDP disintegrating tablet PLEASE SEE ATTACHED FOR DETAILED DIRECTIONS       No current facility-administered medications for this visit.     Allergies   Allergen Reactions    Sulfa Antibiotics Rash     Past Medical History:   Diagnosis Date    ADD (attention deficit disorder) 2005    Depression     RESOLVED     Past Surgical History:   Procedure Laterality Date    WISDOM TOOTH EXTRACTION       Family History   Problem Relation Age of Onset    Stroke Maternal Grandmother     No Known Problems Brother     No Known Problems Mother     No Known Problems Father      Social History     Tobacco Use    Smoking status: Never    Smokeless tobacco: Never   Substance Use Topics    Alcohol use: Yes     Alcohol/week: 8.0 standard drinks of alcohol        ROS: as per HPI otherwise NEG    Objective:  BP 120/70   Ht 1.727 m (5\' 8" )   Wt 76.2 kg (168 lb)   BMI 25.54 kg/m     In NAD. Alert.  Heart -- RRR.  Lungs -- CTA.  Abdomen --  Benign  Extremities -- No edema       No results found for this or any previous visit (from the past 24 hour(s)).    Assessment/Plan:   Diagnosis Orders   1. Attention deficit hyperactivity disorder (ADHD), unspecified ADHD type  amphetamine-dextroamphetamine (ADDERALL) 20 MG tablet    amphetamine-dextroamphetamine (ADDERALL, 20MG ,) 20 MG tablet    amphetamine-dextroamphetamine (ADDERALL, 20MG ,) 20 MG tablet            Adderall 20mg  BID sent x3 mon   F/u 6 mon             Author:  [place], PA 10/26/2022 4:04 PM

## 2023-02-06 ENCOUNTER — Telehealth

## 2023-02-06 NOTE — Telephone Encounter (Signed)
Pt needs a refill      Adderall 20 mg     Pt ph# [phone]     [facility] Pharm# [id number] [place], [place]      Ph# [phone]

## 2023-02-07 MED ORDER — AMPHETAMINE-DEXTROAMPHETAMINE 20 MG PO TABS
20 MG | ORAL_TABLET | Freq: Two times a day (BID) | ORAL | 0 refills | Status: DC
Start: 2023-02-07 — End: 2023-03-08

## 2023-02-07 MED ORDER — AMPHETAMINE-DEXTROAMPHETAMINE 20 MG PO TABS
20 MG | ORAL_TABLET | Freq: Two times a day (BID) | ORAL | 0 refills | Status: DC
Start: 2023-02-07 — End: 2023-05-14

## 2023-02-07 NOTE — Telephone Encounter (Signed)
Sent prior

## 2023-02-07 NOTE — Telephone Encounter (Signed)
Refilled sent x3 mon

## 2023-02-07 NOTE — Telephone Encounter (Signed)
Pt needs a refill      Adderall 20 mg      Pt ph# [phone]     [facility] Pharm# [id number] [place], [place]      Ph# [phone]

## 2023-03-08 ENCOUNTER — Telehealth

## 2023-03-08 MED ORDER — AMPHETAMINE-DEXTROAMPHETAMINE 20 MG PO TABS
20 MG | ORAL_TABLET | Freq: Two times a day (BID) | ORAL | 0 refills | Status: DC
Start: 2023-03-08 — End: 2023-05-14

## 2023-03-08 MED ORDER — AMPHETAMINE-DEXTROAMPHETAMINE 20 MG PO TABS
20 MG | ORAL_TABLET | Freq: Two times a day (BID) | ORAL | 0 refills | Status: AC
Start: 2023-03-08 — End: 2023-05-07

## 2023-03-08 NOTE — Telephone Encounter (Signed)
Pt needs for her ADDERALL 20 mg prescription transferred to a [facility] because [facility] is out of stock       Ph# [place] [address], [place] [phone] Wanda Plump [phone]

## 2023-03-08 NOTE — Telephone Encounter (Signed)
Requested Prescriptions     Signed Prescriptions Disp Refills    amphetamine-dextroamphetamine (ADDERALL, 20MG ,) 20 MG tablet 60 tablet 0     Sig: Take 1 tablet by mouth 2 times daily for 30 days. Max Daily Amount: 40 mg     Authorizing Provider: Alda Lea A    amphetamine-dextroamphetamine (ADDERALL) 20 MG tablet 60 tablet 0     Sig: Take 1 tablet by mouth 2 times daily for 30 days. LAST REFILL UNTIL OFFICE VISIT Max Daily Amount: 40 mg     Authorizing Provider: Montel Culver

## 2023-05-03 ENCOUNTER — Telehealth

## 2023-05-03 NOTE — Telephone Encounter (Signed)
Pt needs a refill      Adderall 20 mg      Pt ph# [phone]     [facility] [address], [place] - [address] - [place] [phone] - F [phone]

## 2023-05-04 MED ORDER — AMPHETAMINE-DEXTROAMPHETAMINE 20 MG PO TABS
20 MG | ORAL_TABLET | Freq: Two times a day (BID) | ORAL | 0 refills | Status: DC
Start: 2023-05-04 — End: 2023-05-14

## 2023-05-04 NOTE — Telephone Encounter (Signed)
Script sent  

## 2023-05-14 ENCOUNTER — Ambulatory Visit: Admit: 2023-05-14 | Payer: PRIVATE HEALTH INSURANCE | Attending: Physician Assistant | Primary: Physician Assistant

## 2023-05-14 DIAGNOSIS — F909 Attention-deficit hyperactivity disorder, unspecified type: Secondary | ICD-10-CM

## 2023-05-14 MED ORDER — AMPHETAMINE-DEXTROAMPHETAMINE 20 MG PO TABS
20 MG | ORAL_TABLET | Freq: Two times a day (BID) | ORAL | 0 refills | Status: AC
Start: 2023-05-14 — End: 2023-07-04

## 2023-05-14 MED ORDER — AMPHETAMINE-DEXTROAMPHETAMINE 20 MG PO TABS
20 MG | ORAL_TABLET | Freq: Two times a day (BID) | ORAL | 0 refills | Status: AC
Start: 2023-05-14 — End: 2023-08-03

## 2023-05-14 MED ORDER — AMPHETAMINE-DEXTROAMPHETAMINE 20 MG PO TABS
20 MG | ORAL_TABLET | Freq: Two times a day (BID) | ORAL | 0 refills | Status: AC
Start: 2023-05-14 — End: 2023-09-02

## 2023-05-14 NOTE — Progress Notes (Signed)
Kelly Lindsey is a 27 y.o. female and presents with   Chief Complaint   Patient presents with    Medication Refill       Here for med check  ADHD -- Adderall working well w/o ill effects    Denies cp, sob or dyspnea. No NVDC, urinary sx, weakness, dizziness or HA today    Current Outpatient Medications   Medication Sig Dispense Refill    [START ON 06/04/2023] amphetamine-dextroamphetamine (ADDERALL, 20MG ,) 20 MG tablet Take 1 tablet by mouth 2 times daily for 30 days. Max Daily Amount: 40 mg 60 tablet 0    [START ON 07/04/2023] amphetamine-dextroamphetamine (ADDERALL, 20MG ,) 20 MG tablet Take 1 tablet by mouth 2 times daily for 30 days. Max Daily Amount: 40 mg 60 tablet 0    [START ON 08/03/2023] amphetamine-dextroamphetamine (ADDERALL) 20 MG tablet Take 1 tablet by mouth 2 times daily for 30 days. Max Daily Amount: 40 mg 60 tablet 0    ondansetron (ZOFRAN-ODT) 8 MG TBDP disintegrating tablet PLEASE SEE ATTACHED FOR DETAILED DIRECTIONS       No current facility-administered medications for this visit.     Allergies   Allergen Reactions    Sulfa Antibiotics Rash     Past Medical History:   Diagnosis Date    ADD (attention deficit disorder) 2005    Depression     RESOLVED     Past Surgical History:   Procedure Laterality Date    WISDOM TOOTH EXTRACTION       Family History   Problem Relation Age of Onset    Stroke Maternal Grandmother     No Known Problems Brother     No Known Problems Mother     No Known Problems Father      Social History     Tobacco Use    Smoking status: Never    Smokeless tobacco: Never   Substance Use Topics    Alcohol use: Yes     Alcohol/week: 8.0 standard drinks of alcohol        ROS: as per HPI otherwise NEG    Objective:  BP 100/60   Ht 1.727 m (5\' 8" )   Wt 71.2 kg (157 lb)   BMI 23.87 kg/m     In NAD. Alert.  Heart -- RRR.  Lungs -- CTA.  Abdomen -- Benign  Extremities -- No edema       No results found for this or any previous visit (from the past 24 hour(s)).    Assessment/Plan:    Diagnosis Orders   1. Attention deficit hyperactivity disorder (ADHD), unspecified ADHD type  amphetamine-dextroamphetamine (ADDERALL, 20MG ,) 20 MG tablet    amphetamine-dextroamphetamine (ADDERALL, 20MG ,) 20 MG tablet    amphetamine-dextroamphetamine (ADDERALL) 20 MG tablet      2. Encounter for medication refill  amphetamine-dextroamphetamine (ADDERALL, 20MG ,) 20 MG tablet    amphetamine-dextroamphetamine (ADDERALL, 20MG ,) 20 MG tablet    amphetamine-dextroamphetamine (ADDERALL) 20 MG tablet              PMP checked   Adderall refilled x3 mon                 Author:  Rolm Baptise Abdel Effinger, PA 05/14/2023 3:44 PM

## 2023-09-26 ENCOUNTER — Telehealth

## 2023-09-26 MED ORDER — AMPHETAMINE-DEXTROAMPHETAMINE 20 MG PO TABS
20 MG | ORAL_TABLET | Freq: Two times a day (BID) | ORAL | 0 refills | Status: AC
Start: 2023-09-26 — End: 2023-10-26

## 2023-09-26 NOTE — Telephone Encounter (Signed)
Sent!

## 2023-09-26 NOTE — Telephone Encounter (Signed)
Pt needs a refill for     Adderall 20 mg     Pt Ph# [phone]    [facility] [id number] [place], [place]     [address]     Ph# [phone]  Fax# [phone]

## 2023-10-10 ENCOUNTER — Ambulatory Visit: Admit: 2023-10-10 | Payer: PRIVATE HEALTH INSURANCE | Attending: Physician Assistant | Primary: Physician Assistant

## 2023-10-10 DIAGNOSIS — Z0000 Encounter for general adult medical examination without abnormal findings: Secondary | ICD-10-CM

## 2023-10-10 MED ORDER — NORETHIN-ETH ESTRAD-FE BIPHAS 1 MG-10 MCG / 10 MCG PO TABS
1 | PACK | Freq: Every day | ORAL | 3 refills | 28.00000 days | Status: DC
Start: 2023-10-10 — End: 2024-09-09

## 2023-10-10 NOTE — Progress Notes (Unsigned)
Comprehensive Visit    Kelly Lindsey is a 27 y.o. female who presents for her comprehensive visit.      Pt is ***.  Interested in OCP          she *** exercise. No CP, SOB or dyspnea.  Denies BRBPR. no NVDC, no urinary sx, no pain, HA, dizziness or weakness today.  No fever, chills, night sweats or cough.     GYN: LMP monthly; LPS 2022   Eye exam: > 78yrs  Dental cleaning: >3 yrs    Past Medical History:   Diagnosis Date    ADD (attention deficit disorder) 2005    Depression     RESOLVED     Past Surgical History:   Procedure Laterality Date    WISDOM TOOTH EXTRACTION       Current Outpatient Medications   Medication Sig Dispense Refill    norethindrone-ethinyl estradiol-Fe (LO LOESTRIN FE) 1 MG-10 MCG / 10 MCG tablet Take 1 tablet by mouth daily 3 packet 3    amphetamine-dextroamphetamine (ADDERALL, 20MG ,) 20 MG tablet Take 1 tablet by mouth 2 times daily for 30 days. LAST REFILL UNTIL PHYSICAL Max Daily Amount: 40 mg 60 tablet 0    amphetamine-dextroamphetamine (ADDERALL, 20MG ,) 20 MG tablet Take 1 tablet by mouth 2 times daily for 30 days. Max Daily Amount: 40 mg 60 tablet 0    amphetamine-dextroamphetamine (ADDERALL) 20 MG tablet Take 1 tablet by mouth 2 times daily for 30 days. Max Daily Amount: 40 mg 60 tablet 0    ondansetron (ZOFRAN-ODT) 8 MG TBDP disintegrating tablet PLEASE SEE ATTACHED FOR DETAILED DIRECTIONS       No current facility-administered medications for this visit.     Allergies   Allergen Reactions    Sulfa Antibiotics Rash     Social History     Tobacco Use    Smoking status: Never    Smokeless tobacco: Never   Substance Use Topics    Alcohol use: Yes     Alcohol/week: 5.0 standard drinks of alcohol     Types: 5 Standard drinks or equivalent per week      Family History   Problem Relation Age of Onset    Stroke Maternal Grandmother     No Known Problems Brother     No Known Problems Mother     No Known Problems Father        Fall risk evaluation negative.***  Depression screen  negative.      Routine maintenance:          Health Maintenance   Topic Date Due    Varicella vaccine (1 of 2 - 13+ 2-dose series) Never done    Hepatitis B vaccine (1 of 3 - 19+ 3-dose series) Never done    Flu vaccine (1) 07/12/2023    COVID-19 Vaccine (3 - 2023-24 season) 08/12/2023    Pap smear  07/25/2024    Depression Monitoring  10/09/2024    DTaP/Tdap/Td vaccine (2 - Td or Tdap) 03/24/2029    HPV vaccine  Completed    Meningococcal (ACWY) vaccine  Completed    Hepatitis C screen  Completed    HIV screen  Completed    Hepatitis A vaccine  Aged Out    Hib vaccine  Aged Out    Polio vaccine  Aged Out    Pneumococcal 0-64 years Vaccine  Aged Out    Depression Screen  Discontinued    Chlamydia/GC screen  Discontinued  ROS: as per HPI otherwise NEG    Objective:  BP 100/60   Ht 1.727 m (5\' 8" )   Wt 68.9 kg (152 lb)   BMI 23.11 kg/m     Physical Exam  ***well developed, well nourished, ***caucasian/AA /female, well groomed, in NAD  HEENT -- NC/AT, PERRL, TM intact, Throat w/o erythema, tongue midline  Neck -- Supple, non-tender, no LAD. no overt thyromegaly.   Heart -- RRR.  Lungs -- CTA.  Abd -- Soft. NT/ND. No masses. BS present.  Extremities -- No LE edema b/l, no cyanosis or erythema, distal pulses intact.  Breasts -- No abnormal masses or ax LAD***Prostate/Scrotum -- Nl size/tone. no nodules.  Skin -- dry, warm, no rash or lesions noted.  Neuro -- non-focal exam, symmetrical facial expressions, moves all extremities    No results found for this or any previous visit (from the past 24 hour(s)).      Assessment/Plan:     Diagnosis Orders   1. General medical exam        2. Attention deficit hyperactivity disorder (ADHD), unspecified ADHD type        3. Encounter for medication refill                Routine maintenance as above.  Labs sent, will contact if abnormal  Diet & exercise regimen rev'd  Advised reg dental & eye exam  Schedule of future lab studies rev'd with pt      SBE, Testicular self checks  encouraged*** f/up with GYN   Discussed vaccines, declines Flu / Tdap at present***  Further mang't pending lab results  RTO 20yr/PRN      LABS ADDERALL           Author:  Rolm Baptise Terita Hejl, PA 10/10/2023 2:21 PM

## 2023-10-11 MED ORDER — AMPHETAMINE-DEXTROAMPHETAMINE 20 MG PO TABS
20 | ORAL_TABLET | Freq: Two times a day (BID) | ORAL | 0 refills | Status: AC
Start: 2023-10-11 — End: 2023-11-27

## 2023-10-11 MED ORDER — AMPHETAMINE-DEXTROAMPHETAMINE 20 MG PO TABS
20 MG | ORAL_TABLET | Freq: Two times a day (BID) | ORAL | 0 refills | Status: DC
Start: 2023-10-11 — End: 2024-03-12

## 2023-10-11 MED ORDER — AMPHETAMINE-DEXTROAMPHETAMINE 20 MG PO TABS
20 MG | ORAL_TABLET | Freq: Two times a day (BID) | ORAL | 0 refills | Status: AC
Start: 2023-10-11 — End: 2024-01-27

## 2023-12-24 ENCOUNTER — Telehealth

## 2023-12-24 NOTE — Telephone Encounter (Signed)
Pt said that she needs her ADDERALL prescription sent to the Pharmacy again and added that she can pick it up on the 15th not the 17th please     Pt ph# [phone]    [facility] [address] [place] - [address] - [place] [phone] - F [phone]

## 2023-12-26 MED ORDER — AMPHETAMINE-DEXTROAMPHETAMINE 20 MG PO TABS
20 | ORAL_TABLET | Freq: Two times a day (BID) | ORAL | 0 refills | Status: DC
Start: 2023-12-26 — End: 2023-12-27

## 2023-12-26 NOTE — Telephone Encounter (Signed)
Requested Prescriptions     Signed Prescriptions Disp Refills    amphetamine-dextroamphetamine (ADDERALL) 20 MG tablet 60 tablet 0     Sig: Take 1 tablet by mouth 2 times daily for 30 days. Max Daily Amount: 40 mg     Authorizing Provider: Montel Culver

## 2023-12-26 NOTE — Telephone Encounter (Signed)
Pt called to update pharmacy information due to previous pharmacy out of stock with medication     Adderall 20 mg     Pt Ph# [phone]    [facility] Pharm# [address] [place], [place]     [address]     Ph# [phone]  Fax# [phone]

## 2023-12-27 ENCOUNTER — Telehealth

## 2023-12-27 MED ORDER — AMPHETAMINE-DEXTROAMPHETAMINE 20 MG PO TABS
20 MG | ORAL_TABLET | Freq: Two times a day (BID) | ORAL | 0 refills | Status: DC
Start: 2023-12-27 — End: 2024-03-12

## 2023-12-27 NOTE — Telephone Encounter (Signed)
Script sent to new pharmacy   Requested Prescriptions     Signed Prescriptions Disp Refills    amphetamine-dextroamphetamine (ADDERALL) 20 MG tablet 60 tablet 0     Sig: Take 1 tablet by mouth 2 times daily for 30 days. Max Daily Amount: 40 mg     Authorizing Provider: Jaclyn Prime

## 2023-12-27 NOTE — Telephone Encounter (Signed)
Script sent  

## 2023-12-27 NOTE — Telephone Encounter (Signed)
Pt called to follow-up with the nurse regarding medication refill request. Pt will be out of meds today      Ph# [phone]

## 2024-02-22 ENCOUNTER — Telehealth

## 2024-02-22 MED ORDER — AMPHETAMINE-DEXTROAMPHETAMINE 20 MG PO TABS
20 | ORAL_TABLET | Freq: Two times a day (BID) | ORAL | 0 refills | Status: DC
Start: 2024-02-22 — End: 2024-03-12

## 2024-02-22 NOTE — Telephone Encounter (Signed)
 Pt needs a refill for     Adderall 20 mg     Pt Ph# [phone]    [facility] [id number] [place], [place]     [address]     Ph# [phone]  Fax# [phone]

## 2024-02-22 NOTE — Telephone Encounter (Signed)
 sent

## 2024-03-12 ENCOUNTER — Ambulatory Visit: Admit: 2024-03-12 | Payer: PRIVATE HEALTH INSURANCE | Attending: Physician Assistant | Primary: Physician Assistant

## 2024-03-12 VITALS — BP 100/60 | Ht 68.0 in | Wt 162.0 lb

## 2024-03-12 DIAGNOSIS — F909 Attention-deficit hyperactivity disorder, unspecified type: Secondary | ICD-10-CM

## 2024-03-12 MED ORDER — AMPHETAMINE-DEXTROAMPHETAMINE 20 MG PO TABS
20 | ORAL_TABLET | Freq: Two times a day (BID) | ORAL | 0 refills | Status: DC
Start: 2024-03-12 — End: 2024-06-19

## 2024-03-12 NOTE — Progress Notes (Signed)
 Kelly Lindsey is a 28 y.o. female and presents with   Chief Complaint   Patient presents with    Medication Refill       Here for 6 mon med check, no complaints  ADHD -- Adderall working well w/o ill effects      Denies cp, sob or dyspnea. No NVDC, urinary sx, weakness, dizziness or HA today    Current Outpatient Medications   Medication Sig Dispense Refill    [START ON 03/24/2024] amphetamine-dextroamphetamine (ADDERALL) 20 MG tablet Take 1 tablet by mouth 2 times daily for 30 days. Max Daily Amount: 40 mg 60 tablet 0    [START ON 04/23/2024] amphetamine-dextroamphetamine (ADDERALL, 20MG ,) 20 MG tablet Take 1 tablet by mouth 2 times daily for 30 days. Max Daily Amount: 40 mg 60 tablet 0    [START ON 05/23/2024] amphetamine-dextroamphetamine (ADDERALL, 20MG ,) 20 MG tablet Take 1 tablet by mouth 2 times daily for 30 days. Max Daily Amount: 40 mg 60 tablet 0    norethindrone-ethinyl estradiol-Fe (LO LOESTRIN FE) 1 MG-10 MCG / 10 MCG tablet Take 1 tablet by mouth daily 3 packet 3    ondansetron (ZOFRAN-ODT) 8 MG TBDP disintegrating tablet PLEASE SEE ATTACHED FOR DETAILED DIRECTIONS       No current facility-administered medications for this visit.     Allergies   Allergen Reactions    Sulfa Antibiotics Rash     Past Medical History:   Diagnosis Date    ADD (attention deficit disorder) 2005    Depression     RESOLVED     Past Surgical History:   Procedure Laterality Date    WISDOM TOOTH EXTRACTION       Family History   Problem Relation Age of Onset    Stroke Maternal Grandmother     No Known Problems Brother     No Known Problems Mother     No Known Problems Father      Social History     Tobacco Use    Smoking status: Never    Smokeless tobacco: Never   Substance Use Topics    Alcohol use: Yes     Alcohol/week: 5.0 standard drinks of alcohol     Types: 5 Standard drinks or equivalent per week        ROS: as per HPI otherwise NEG    Objective:  BP 100/60   Ht 1.727 m (5\' 8" )   Wt 73.5 kg (162 lb)   BMI 24.63 kg/m      In NAD. Alert.  Heart -- RRR.  Lungs -- CTA.  Abdomen -- Benign  Extremities -- No edema       No results found for this or any previous visit (from the past 24 hours).    Assessment/Plan:   Diagnosis Orders   1. Attention deficit hyperactivity disorder (ADHD), unspecified ADHD type  amphetamine-dextroamphetamine (ADDERALL) 20 MG tablet    amphetamine-dextroamphetamine (ADDERALL, 20MG ,) 20 MG tablet    amphetamine-dextroamphetamine (ADDERALL, 20MG ,) 20 MG tablet      2. Encounter for medication refill  amphetamine-dextroamphetamine (ADDERALL) 20 MG tablet    amphetamine-dextroamphetamine (ADDERALL, 20MG ,) 20 MG tablet    amphetamine-dextroamphetamine (ADDERALL, 20MG ,) 20 MG tablet            PMP checked  Adderall refilled x3 mon              Author:  Rolm Baptise Kahlie Deutscher, PA 03/12/2024 7:58 PM

## 2024-05-08 ENCOUNTER — Telehealth

## 2024-05-08 NOTE — Telephone Encounter (Signed)
 Pt has a question about her ADDERALL and would like for the nurse to call her please     Ph# [phone]

## 2024-05-09 NOTE — Telephone Encounter (Signed)
 Pt leaving for vacation next Thursday 6/5 and refill would be due while she's gone per pt. Pharm told pt she would feel comfortable filling a 7 day supply while pt is on vacation and get rest filled when she returns. Would you fill Adderall  20mg  BID x 7 days. She said could pick it up at pharm next Wed 6/4.    Pt# [phone]      [facility] [place] [address], [place] - [address] - [place] [phone] - F [phone]

## 2024-05-13 MED ORDER — AMPHETAMINE-DEXTROAMPHETAMINE 20 MG PO TABS
20 | ORAL_TABLET | Freq: Two times a day (BID) | ORAL | 0 refills | 30.00000 days | Status: DC
Start: 2024-05-13 — End: 2024-06-19

## 2024-05-13 NOTE — Telephone Encounter (Signed)
 Error

## 2024-05-13 NOTE — Telephone Encounter (Signed)
 Requested Prescriptions     Signed Prescriptions Disp Refills    amphetamine -dextroamphetamine  (ADDERALL, 20MG ,) 20 MG tablet 14 tablet 0     Sig: Take 1 tablet by mouth 2 times daily for 7 days. Max Daily Amount: 40 mg     Authorizing Provider: Sydnee Evans

## 2024-06-19 ENCOUNTER — Telehealth

## 2024-06-19 MED ORDER — AMPHETAMINE-DEXTROAMPHETAMINE 20 MG PO TABS
20 | ORAL_TABLET | Freq: Two times a day (BID) | ORAL | 0 refills | 30.00000 days | Status: DC
Start: 2024-06-19 — End: 2024-10-10

## 2024-06-19 MED ORDER — AMPHETAMINE-DEXTROAMPHETAMINE 20 MG PO TABS
20 | ORAL_TABLET | Freq: Two times a day (BID) | ORAL | 0 refills | 30.00000 days | Status: DC
Start: 2024-06-19 — End: 2024-11-10

## 2024-06-19 MED ORDER — AMPHETAMINE-DEXTROAMPHETAMINE 20 MG PO TABS
20 | ORAL_TABLET | Freq: Two times a day (BID) | ORAL | 0 refills | 30.00000 days | Status: AC
Start: 2024-06-19 — End: 2024-09-17

## 2024-06-19 NOTE — Telephone Encounter (Signed)
Scripts sent x3 mon

## 2024-06-19 NOTE — Telephone Encounter (Signed)
 Last visit 03/12/24- med check

## 2024-06-19 NOTE — Telephone Encounter (Signed)
 Pt needs a refill for     Adderall 20 mg     Pt Ph# [phone]    [facility] [id number] [place], [place]     [address]     Ph# [phone]  Fax# [phone]

## 2024-07-31 ENCOUNTER — Telehealth

## 2024-07-31 MED ORDER — AMPHETAMINE-DEXTROAMPHETAMINE 20 MG PO TABS
20 | ORAL_TABLET | Freq: Two times a day (BID) | ORAL | 0 refills | 30.00000 days | Status: DC
Start: 2024-07-31 — End: 2024-11-10

## 2024-07-31 NOTE — Telephone Encounter (Signed)
 [place] leaving out of the country [place] and would like to speak with the PA to discuss what options she can get for her medications. Pt will run out of meds while out of the country     Ph# [phone]

## 2024-07-31 NOTE — Telephone Encounter (Signed)
 Requested Prescriptions     Signed Prescriptions Disp Refills    amphetamine-dextroamphetamine (ADDERALL) 20 MG tablet 60 tablet 0     Sig: Take 1 tablet by mouth 2 times daily for 30 days. Max Daily Amount: 40 mg     Authorizing Provider: Jaclyn Prime

## 2024-07-31 NOTE — Telephone Encounter (Signed)
 Pt advised new script sent to pharm to get filled early

## 2024-09-09 MED ORDER — NORETHINDRONE ACET-ETHINYL EST 1-20 MG-MCG PO TABS
1-20 | PACK | Freq: Every day | ORAL | 1 refills | 30.00000 days | Status: DC
Start: 2024-09-09 — End: 2025-01-08

## 2024-09-09 NOTE — Telephone Encounter (Signed)
 Pt states she uses Lo Loestrin Fe but insurance told her Junel Fe is comparable and 0 copay        [facility] [address] [place] - [address] - [place] [phone] - F [phone]

## 2024-09-09 NOTE — Telephone Encounter (Signed)
 Requested Prescriptions     Signed Prescriptions Disp Refills    norethindrone-ethinyl estradiol (JUNEL 1/20) 1-20 MG-MCG per tablet 3 packet 1     Sig: Take 1 tablet by mouth daily     Authorizing Provider: DONETA JEFFREY LABOR

## 2024-09-09 NOTE — Telephone Encounter (Signed)
 Pt needs for the nurse to call her please. She said that the Copay has went up on her birthcontrol and she can no longer afford it     Ph# [phone]

## 2024-10-10 ENCOUNTER — Telehealth

## 2024-10-10 MED ORDER — AMPHETAMINE-DEXTROAMPHETAMINE 20 MG PO TABS
20 | ORAL_TABLET | Freq: Two times a day (BID) | ORAL | 0 refills | 30.00000 days | Status: DC
Start: 2024-10-10 — End: 2024-11-10

## 2024-10-10 NOTE — Telephone Encounter (Signed)
 Pt notified and appt made

## 2024-10-10 NOTE — Telephone Encounter (Signed)
 Pt needs a refill for     Adderall 20 mg     Pt Ph# [phone]    [facility] [id number] [place], [place]     [address]     Ph# [phone]  Fax# [phone]

## 2024-10-10 NOTE — Telephone Encounter (Signed)
 Last visit 03/12/24- med check

## 2024-10-10 NOTE — Telephone Encounter (Signed)
"  Requested Prescriptions     Signed Prescriptions Disp Refills    amphetamine -dextroamphetamine  (ADDERALL) 20 MG tablet 60 tablet 0     Sig: Take 1 tablet by mouth 2 times daily for 30 days. LAST REFILL UNTIL PHYSICAL Max Daily Amount: 40 mg     Authorizing Provider: DONETA ALLEN A       "

## 2024-10-31 ENCOUNTER — Ambulatory Visit: Admit: 2024-10-31 | Payer: PRIVATE HEALTH INSURANCE | Attending: Physician Assistant | Primary: Physician Assistant

## 2024-10-31 VITALS — BP 122/74 | Ht 67.0 in | Wt 159.0 lb

## 2024-10-31 DIAGNOSIS — Z23 Encounter for immunization: Principal | ICD-10-CM

## 2024-10-31 DIAGNOSIS — Z0000 Encounter for general adult medical examination without abnormal findings: Secondary | ICD-10-CM

## 2024-10-31 NOTE — Progress Notes (Signed)
 "Comprehensive Visit    Kelly Lindsey is a 28 y.o. female who presents for her comprehensive visit.      Pt is doing well, no complaints  ADHD -- Adderall working well w/o ill effects.    she does reg exercise. No CP, SOB or dyspnea.  Denies BRBPR. no NVDC, no urinary sx, no pain, HA, dizziness or weakness today.  No fever, chills, night sweats or cough.     GYN: LMP monthly; LPS 2022  Eye exam: >2 yr  Dental cleaning: >2 yr  Derm: peri-oral dermatitis     Past Medical History:   Diagnosis Date    ADD (attention deficit disorder) 2005    Depression     RESOLVED     Past Surgical History:   Procedure Laterality Date    DILATION AND CURETTAGE OF UTERUS  11/2023    VTOP    WISDOM TOOTH EXTRACTION       Current Outpatient Medications   Medication Sig Dispense Refill    doxycycline hyclate (VIBRAMYCIN) 100 MG capsule Take 1 capsule by mouth daily      amphetamine -dextroamphetamine  (ADDERALL) 20 MG tablet Take 1 tablet by mouth 2 times daily for 30 days. LAST REFILL UNTIL PHYSICAL Max Daily Amount: 40 mg 60 tablet 0    norethindrone -ethinyl estradiol (JUNEL 1/20) 1-20 MG-MCG per tablet Take 1 tablet by mouth daily 3 packet 1    amphetamine -dextroamphetamine  (ADDERALL) 20 MG tablet Take 1 tablet by mouth 2 times daily for 30 days. Max Daily Amount: 40 mg 60 tablet 0    amphetamine -dextroamphetamine  (ADDERALL) 20 MG tablet Take 1 tablet by mouth 2 times daily for 30 days. Max Daily Amount: 40 mg 60 tablet 0    ondansetron (ZOFRAN-ODT) 8 MG TBDP disintegrating tablet PLEASE SEE ATTACHED FOR DETAILED DIRECTIONS       No current facility-administered medications for this visit.     Allergies   Allergen Reactions    Sulfa Antibiotics Rash     Social History     Tobacco Use    Smoking status: Never    Smokeless tobacco: Never   Substance Use Topics    Alcohol use: Yes     Alcohol/week: 5.0 standard drinks of alcohol     Types: 5 Standard drinks or equivalent per week      Family History   Problem Relation Age of Onset    No  Known Problems Mother     No Known Problems Father     Hypertension Brother     Stroke Maternal Grandmother     Cerebral Aneurysm Maternal Grandfather        Depression screen negative.      Routine maintenance:          Health Maintenance   Topic Date Due    Varicella vaccine (1 of 2 - 13+ 2-dose series) Never done    Hepatitis B vaccine (1 of 3 - 19+ 3-dose series) Never done    Pap smear  07/25/2024    COVID-19 Vaccine (3 - 2025-26 season) 08/11/2024    Depression Screen  10/31/2025    DTaP/Tdap/Td vaccine (2 - Td or Tdap) 03/24/2029    HPV vaccine  Completed    Meningococcal (ACWY) vaccine  Completed    Flu vaccine  Completed    Hepatitis C screen  Completed    HIV screen  Completed    Hepatitis A vaccine  Aged Out    Hib vaccine  Aged Out  Polio vaccine  Aged Out    Meningococcal B vaccine  Aged Out    Pneumococcal 0-49 years Vaccine  Aged Out    Depression Monitoring  Discontinued    Chlamydia/GC screen  Discontinued        ROS: as per HPI otherwise NEG    Objective:  BP 122/74   Ht 1.702 m (5' 7)   Wt 72.1 kg (159 lb)   BMI 24.90 kg/m     Physical Exam  Well developed, well nourished, caucasian female, well groomed, in NAD  HEENT -- NC/AT, PERRL, TM intact, Throat w/o erythema, tongue midline  Neck -- Supple, non-tender, no LAD. no overt thyromegaly.   Heart -- RRR.  Lungs -- CTA.  Abd -- Soft. NT/ND. No masses. BS present.  Extremities -- No LE edema b/l, no cyanosis or erythema, distal pulses intact.  Breasts -- No abnormal masses or ax LAD  Skin -- dry, warm, no rash or lesions noted.  Neuro -- non-focal exam, symmetrical facial expressions, moves all extremities    No results found for this or any previous visit (from the past 24 hours).      Assessment/Plan:     Diagnosis Orders   1. General medical exam  Hemoglobin A1C    Lipid Panel    Comprehensive Metabolic Panel    CBC with Auto Differential    UA+Urine Culture      2. Attention deficit hyperactivity disorder (ADHD), unspecified ADHD type         3. Flu vaccine need  Influenza, FLUBLOK Trivalent, (age 91 y+), IM, Preservative Free, 0.5mL      4. Encounter for medication refill                Routine maintenance as above.  Labs sent, will contact if abnormal  Agrees to Flu vaccine today   PMP checked  Can call for 3 mon refill in 1 week  RTO 6 mon med check and pap                  Author:  Jeffrey LABOR Minha Fulco, PA 11/02/2024 4:33 PM  "

## 2024-11-01 LAB — CBC WITH AUTO DIFFERENTIAL
Basophils %: 1 %
Basophils Absolute: 0.1 x10E3/uL (ref 0.0–0.2)
Eosinophils %: 2 %
Eosinophils Absolute: 0.1 x10E3/uL (ref 0.0–0.4)
Hematocrit: 43.3 % (ref 34.0–46.6)
Hemoglobin: 14.4 g/dL (ref 11.1–15.9)
Immature Grans (Abs): 0 x10E3/uL (ref 0.0–0.1)
Immature Granulocytes %: 0 %
Lymphocytes %: 29 %
Lymphocytes Absolute: 2.4 x10E3/uL (ref 0.7–3.1)
MCH: 32.8 pg (ref 26.6–33.0)
MCHC: 33.3 g/dL (ref 31.5–35.7)
MCV: 99 fL — ABNORMAL HIGH (ref 79–97)
Monocytes %: 7 %
Monocytes Absolute: 0.6 x10E3/uL (ref 0.1–0.9)
Neutrophils %: 61 %
Neutrophils Absolute: 5 x10E3/uL (ref 1.4–7.0)
Platelets: 278 x10E3/uL (ref 150–450)
RBC: 4.39 x10E6/uL (ref 3.77–5.28)
RDW: 11.2 % — ABNORMAL LOW (ref 11.7–15.4)
WBC: 8.2 x10E3/uL (ref 3.4–10.8)

## 2024-11-01 LAB — COMPREHENSIVE METABOLIC PANEL
ALT: 30 IU/L (ref 0–32)
AST: 27 IU/L (ref 0–40)
Albumin: 4.7 g/dL (ref 4.0–5.0)
Alkaline Phosphatase: 46 IU/L (ref 41–116)
BUN/Creatinine Ratio: 26 — ABNORMAL HIGH (ref 9–23)
BUN: 14 mg/dL (ref 6–20)
CO2: 23 mmol/L (ref 20–29)
Calcium: 9.6 mg/dL (ref 8.7–10.2)
Chloride: 102 mmol/L (ref 96–106)
Creatinine: 0.53 mg/dL — ABNORMAL LOW (ref 0.57–1.00)
Est, Glom Filt Rate: 128 mL/min/1.73 (ref >59)
Globulin, Total: 2.2 g/dL (ref 1.5–4.5)
Glucose: 81 mg/dL (ref 70–99)
Potassium: 4.1 mmol/L (ref 3.5–5.2)
Sodium: 138 mmol/L (ref 134–144)
Total Bilirubin: 0.4 mg/dL (ref 0.0–1.2)
Total Protein: 6.9 g/dL (ref 6.0–8.5)

## 2024-11-01 LAB — LIPID PANEL
Cholesterol, Total: 216 mg/dL — ABNORMAL HIGH (ref 100–199)
HDL: 95 mg/dL (ref >39)
LDL Cholesterol: 109 mg/dL — ABNORMAL HIGH (ref 0–99)
Triglycerides: 70 mg/dL (ref 0–149)
VLDL Cholesterol Calculated: 12 mg/dL (ref 5–40)

## 2024-11-01 LAB — HEMOGLOBIN A1C
Estimated Avg Glucose: 100 mg/dL
Hemoglobin A1C: 5.1 % (ref 4.8–5.6)

## 2024-11-02 LAB — UA+URINE CULTURE
Bilirubin, Urine: NEGATIVE
Blood, Urine: NEGATIVE
Glucose, Ur: NEGATIVE
Leukocyte Esterase, Urine: NEGATIVE
Nitrite, Urine: NEGATIVE
Specific Gravity, UA: >=1.03 — AB (ref 1.005–1.030)
Urobilinogen, Urine: 0.2 mg/dL (ref 0.2–1.0)
pH, Urine: 6 (ref 5.0–7.5)

## 2024-11-10 ENCOUNTER — Telehealth

## 2024-11-10 MED ORDER — AMPHETAMINE-DEXTROAMPHETAMINE 20 MG PO TABS
20 | ORAL_TABLET | Freq: Two times a day (BID) | ORAL | 0 refills | 30.00000 days | Status: AC
Start: 2024-11-10 — End: 2025-01-09

## 2024-11-10 MED ORDER — AMPHETAMINE-DEXTROAMPHETAMINE 20 MG PO TABS
20 | ORAL_TABLET | Freq: Two times a day (BID) | ORAL | 0 refills | 30.00000 days | Status: AC
Start: 2024-11-10 — End: 2025-02-08

## 2024-11-10 MED ORDER — AMPHETAMINE-DEXTROAMPHETAMINE 20 MG PO TABS
20 | ORAL_TABLET | Freq: Two times a day (BID) | ORAL | 0 refills | 30.00000 days | Status: AC
Start: 2024-11-10 — End: 2024-12-10

## 2024-11-10 NOTE — Telephone Encounter (Signed)
 Pt needs a refill for     Adderall 20 mg     Pt Ph# [phone]    [facility] [id number] [place], [place]     [address]     Ph# [phone]  Fax# [phone]

## 2024-11-10 NOTE — Telephone Encounter (Signed)
"  Script sent x3 mon   "

## 2024-11-10 NOTE — Telephone Encounter (Signed)
"  Last visit 10/31/24- CPE  "

## 2025-01-08 MED ORDER — MICROGESTIN 1/20 1-20 MG-MCG PO TABS
1-20 | ORAL_TABLET | Freq: Every day | ORAL | 0 refills | Status: AC
Start: 2025-01-08 — End: ?

## 2025-01-08 NOTE — Telephone Encounter (Signed)
 Refill Microgestin
